# Patient Record
Sex: Male | Born: 1935 | Race: White | Hispanic: No | Smoking: Former smoker
Health system: Southern US, Community
[De-identification: ages and names within clinical notes are randomized; demographics above are authoritative.]

## PROBLEM LIST (undated history)

## (undated) DIAGNOSIS — E785 Hyperlipidemia, unspecified: Secondary | ICD-10-CM

## (undated) DIAGNOSIS — R053 Chronic cough: Secondary | ICD-10-CM

## (undated) DIAGNOSIS — F419 Anxiety disorder, unspecified: Secondary | ICD-10-CM

## (undated) DIAGNOSIS — I10 Essential (primary) hypertension: Secondary | ICD-10-CM

## (undated) DIAGNOSIS — R05 Cough: Secondary | ICD-10-CM

## (undated) HISTORY — PX: KNEE SURGERY: SHX244

## (undated) HISTORY — PX: TONSILLECTOMY: SUR1361

## (undated) HISTORY — DX: Chronic cough: R05.3

## (undated) HISTORY — DX: Cough: R05

## (undated) HISTORY — DX: Hyperlipidemia, unspecified: E78.5

## (undated) HISTORY — DX: Anxiety disorder, unspecified: F41.9

---

## 2013-05-14 ENCOUNTER — Emergency Department (HOSPITAL_BASED_OUTPATIENT_CLINIC_OR_DEPARTMENT_OTHER): Payer: Medicare Other

## 2013-05-14 ENCOUNTER — Emergency Department (HOSPITAL_BASED_OUTPATIENT_CLINIC_OR_DEPARTMENT_OTHER)
Admission: EM | Admit: 2013-05-14 | Discharge: 2013-05-14 | Disposition: A | Discharge status: Home / Self Care | Payer: Medicare Other | Attending: Emergency Medicine | Admitting: Emergency Medicine

## 2013-05-14 ENCOUNTER — Encounter (HOSPITAL_BASED_OUTPATIENT_CLINIC_OR_DEPARTMENT_OTHER): Payer: Self-pay

## 2013-05-14 ENCOUNTER — Other Ambulatory Visit: Payer: Self-pay

## 2013-05-14 DIAGNOSIS — Z79899 Other long term (current) drug therapy: Secondary | ICD-10-CM | POA: Insufficient documentation

## 2013-05-14 DIAGNOSIS — F17200 Nicotine dependence, unspecified, uncomplicated: Secondary | ICD-10-CM | POA: Insufficient documentation

## 2013-05-14 DIAGNOSIS — R42 Dizziness and giddiness: Secondary | ICD-10-CM | POA: Insufficient documentation

## 2013-05-14 DIAGNOSIS — H5710 Ocular pain, unspecified eye: Secondary | ICD-10-CM | POA: Insufficient documentation

## 2013-05-14 DIAGNOSIS — I10 Essential (primary) hypertension: Secondary | ICD-10-CM

## 2013-05-14 HISTORY — DX: Essential (primary) hypertension: I10

## 2013-05-14 LAB — URINALYSIS, ROUTINE W REFLEX MICROSCOPIC
Bilirubin Urine: NEGATIVE
Glucose, UA: NEGATIVE mg/dL
Specific Gravity, Urine: 1.014 (ref 1.005–1.030)
pH: 6.5 (ref 5.0–8.0)

## 2013-05-14 LAB — URINE MICROSCOPIC-ADD ON

## 2013-05-14 NOTE — ED Provider Notes (Signed)
History  This chart was scribed for Glynn Octave, MD by Ardelia Mems, ED Scribe. This patient was seen in room MH06/MH06 and the patient's care was started at 9:48 PM.  CSN: [id number]  Arrival date & time 05/14/13  2035    Chief Complaint  Patient presents with  . Hypertension     The history is provided by the patient. No language interpreter was used.   HPI Comments: Andres Murray is a 79 y.o. male with a hx of HTN who presents to the Emergency Department complaining of HTN that is elevated above baseline with 3 hours of associated pain in the corner of his right eye, which has now subsided. Pt states that he has had similar pain with spikes in his BP. Pt denies headache and denies visual disturbances. He reports feeling light headed but denies associated dizziness. Pt states that he has a long hx of HTN, and has taken BP medications for years. Pt states that he has not had a recent change in dosage of his BP medications, but he desires one. Pt states that he is compliant with his BP medication and states that he took it tonight. Pt denies eye pain currently and states that he feels fine now. Pt states that his vision is normal. Pt's daily medications are Nexium, Lipitor, Flomax and Exforge for his HTN. Pt was alarmed that his BP was 174/- 2 hours ago, which he states is significantly elevated for him. He states that he checks his BP at home regularly, and sees his PCP for HTN check-ups every 6 months. Pt denies chest pain, SOB, abdominal pain or any other symptoms.  PCP- None  Past Medical History  Diagnosis Date  . Hypertension    History reviewed. No pertinent past surgical history. No family history on file. History  Substance Use Topics  . Smoking status: Current Every Day Smoker    Types: Cigars  . Smokeless tobacco: Not on file  . Alcohol Use: Yes    Review of Systems  Constitutional: Negative for fever and chills.  HENT: Negative for congestion, sore throat and  rhinorrhea.   Eyes: Positive for pain. Negative for visual disturbance.  Respiratory: Negative for cough and shortness of breath.   Cardiovascular: Negative for chest pain.  Gastrointestinal: Negative for nausea, vomiting, abdominal pain and diarrhea.  Genitourinary: Negative for dysuria.  Musculoskeletal: Negative for back pain.  Skin: Negative for rash.  Neurological: Positive for light-headedness. Negative for dizziness and headaches.  Psychiatric/Behavioral: Negative for confusion.  A complete 10 system review of systems was obtained and all systems are negative except as noted in the HPI and PMH.   Allergies  Review of patient's allergies indicates no known allergies.  Home Medications   Current Outpatient Rx  Name  Route  Sig  Dispense  Refill  . amLODipine-valsartan (EXFORGE) 5-320 MG per tablet   Oral   Take 1 tablet by mouth daily.         [name] atorvastatin (LIPITOR) 20 MG tablet   Oral   Take 20 mg by mouth daily.         [name] esomeprazole (NEXIUM) 40 MG capsule   Oral   Take 40 mg by mouth daily before breakfast.         . tamsulosin (FLOMAX) 0.4 MG CAPS   Oral   Take 0.4 mg by mouth.          Triage Vitals: BP 168/83  Pulse 80  Temp(Src) 98.7 F (37.1 C) (Oral)  Resp 20  Ht 5\' 5"  (1.651 m)  Wt 138 lb (62.596 kg)  BMI 22.96 kg/m2  SpO2 98%  Physical Exam  Nursing note and vitals reviewed. Constitutional: He is oriented to person, place, and time. He appears well-developed and well-nourished. No distress.  HENT:  Head: Normocephalic and atraumatic.  Mouth/Throat: Oropharynx is clear and moist. No oropharyngeal exudate.  Eyes: Conjunctivae and EOM are normal. Pupils are equal, round, and reactive to light. Right eye exhibits no discharge. Left eye exhibits no discharge.  Neck: Normal range of motion. Neck supple. No tracheal deviation present.  Cardiovascular: Normal rate.   No murmur heard. Pulmonary/Chest: Effort normal. No respiratory distress.   Abdominal: Soft. There is no tenderness. There is no rebound and no guarding.  Musculoskeletal: Normal range of motion. He exhibits no edema and no tenderness.  Neurological: He is alert and oriented to person, place, and time. No cranial nerve deficit. He exhibits normal muscle tone. Coordination normal.   CN 2-12 intact, no ataxia on finger to nose, no nystagmus, 5/5 strength throughout, no pronator drift, Romberg negative, normal gait.   Skin: Skin is warm and dry.  Psychiatric: He has a normal mood and affect. His behavior is normal.    ED Course  Procedures (including critical care time)  DIAGNOSTIC STUDIES: Oxygen Saturation is 98% on RA, normal by my interpretation.    COORDINATION OF CARE: 9:55 PM- Pt advised of plan for treatment and pt agrees.   Labs Reviewed  URINALYSIS, ROUTINE W REFLEX MICROSCOPIC - Abnormal; Notable for the following:    Hgb urine dipstick SMALL (*)    All other components within normal limits  URINE MICROSCOPIC-ADD ON   Ct Head Wo Contrast  05/14/2013   *RADIOLOGY REPORT*  Clinical Data: Headache.  Hypertension.  CT HEAD WITHOUT CONTRAST  Technique:  Contiguous axial images were obtained from the base of the skull through the vertex without contrast.  Comparison: None.  Findings: No mass lesion, mass effect, midline shift, hydrocephalus, hemorrhage.  No acute territorial cortical ischemia/infarct. Atrophy and chronic ischemic white matter disease is present.  Right maxillary antrectomy is present. There is fat attenuation in the quadrigeminal plate cistern, most compatible with a dermoid.  There is no fat within the ventricular system to suggest ruptured dermoid cyst.  Intracranial atherosclerosis.  IMPRESSION: 1.  No acute intracranial abnormality. 2.  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                                                                       Original Report Authenticated By: Andreas Newport, M.D.    No diagnosis found.  MDM   Patient complains of elevated blood pressure. Denies headache, eye pain, vision change, chest pain or shortness of breath. States compliance with medications. Some pain below his eye earlier tonight but is now resolved. Spouse states he had elevated blood pressure for a long time. No vision changes, weakness, numbness, tingling, speech changes to suggest TIA. He refuses any blood work. Neuro exam is nonfocal. Blood pressure has been 140s to 160s in the ED systolic. Patient is asymptomatic. No chest pain, shortness of breath, dizziness or lightheadedness.  He is he refuses any blood work to assess for kidney function.  He is advised to followup with his doctor regarding his blood pressure medication adjustments. Return precautions discussed. BP 150/66  Pulse 80  Temp(Src) 98.7 F (37.1 C) (Oral)  Resp 20  Ht 5\' 5"  (1.651 m)  Wt 138 lb (62.596 kg)  BMI 22.96 kg/m2  SpO2 98%    Date: 05/14/2013  Rate: 64  Rhythm: normal sinus rhythm  QRS Axis: normal  Intervals: normal  ST/T Wave abnormalities: normal  Conduction Disutrbances:none  Narrative Interpretation:   Old EKG Reviewed: none available    I personally performed the services described in this documentation, which was scribed in my presence. The recorded information has been reviewed and is accurate.     Glynn Octave, MD 05/15/13 [phone]

## 2013-05-14 NOTE — ED Notes (Signed)
C/o elevated BP x 5 days-pain to right eye x 1-2 hours-denies pain at present

## 2014-03-09 ENCOUNTER — Emergency Department (HOSPITAL_BASED_OUTPATIENT_CLINIC_OR_DEPARTMENT_OTHER): Payer: Medicare Other

## 2014-03-09 ENCOUNTER — Emergency Department (HOSPITAL_BASED_OUTPATIENT_CLINIC_OR_DEPARTMENT_OTHER)
Admission: EM | Admit: 2014-03-09 | Discharge: 2014-03-09 | Disposition: A | Discharge status: Other Acute Inpatient Hospital | Payer: Medicare Other | Attending: Emergency Medicine | Admitting: Emergency Medicine

## 2014-03-09 ENCOUNTER — Encounter (HOSPITAL_BASED_OUTPATIENT_CLINIC_OR_DEPARTMENT_OTHER): Payer: Self-pay | Admitting: Emergency Medicine

## 2014-03-09 DIAGNOSIS — E7800 Pure hypercholesterolemia, unspecified: Secondary | ICD-10-CM | POA: Insufficient documentation

## 2014-03-09 DIAGNOSIS — I10 Essential (primary) hypertension: Secondary | ICD-10-CM | POA: Insufficient documentation

## 2014-03-09 DIAGNOSIS — Z79899 Other long term (current) drug therapy: Secondary | ICD-10-CM | POA: Diagnosis not present

## 2014-03-09 DIAGNOSIS — R4182 Altered mental status, unspecified: Secondary | ICD-10-CM | POA: Insufficient documentation

## 2014-03-09 DIAGNOSIS — F17200 Nicotine dependence, unspecified, uncomplicated: Secondary | ICD-10-CM | POA: Insufficient documentation

## 2014-03-09 LAB — DIFFERENTIAL
Basophils Absolute: 0 10*3/uL (ref 0.0–0.1)
Basophils Relative: 1 % (ref 0–1)
EOS PCT: 5 % (ref 0–5)
Eosinophils Absolute: 0.3 10*3/uL (ref 0.0–0.7)
LYMPHS ABS: 1.3 10*3/uL (ref 0.7–4.0)
LYMPHS PCT: 21 % (ref 12–46)
MONO ABS: 0.7 10*3/uL (ref 0.1–1.0)
Monocytes Relative: 11 % (ref 3–12)
Neutro Abs: 4.2 10*3/uL (ref 1.7–7.7)
Neutrophils Relative %: 64 % (ref 43–77)

## 2014-03-09 LAB — COMPREHENSIVE METABOLIC PANEL
ALT: 20 U/L (ref 0–53)
AST: 21 U/L (ref 0–37)
Albumin: 4.2 g/dL (ref 3.5–5.2)
Alkaline Phosphatase: 61 U/L (ref 39–117)
BILIRUBIN TOTAL: 0.4 mg/dL (ref 0.3–1.2)
BUN: 16 mg/dL (ref 6–23)
CALCIUM: 10.2 mg/dL (ref 8.4–10.5)
CO2: 31 meq/L (ref 19–32)
Chloride: 102 mEq/L (ref 96–112)
Creatinine, Ser: 1.1 mg/dL (ref 0.50–1.35)
GFR, EST AFRICAN AMERICAN: 73 mL/min — AB (ref >90)
GFR, EST NON AFRICAN AMERICAN: 63 mL/min — AB (ref >90)
GLUCOSE: 145 mg/dL — AB (ref 70–99)
Potassium: 4 mEq/L (ref 3.7–5.3)
SODIUM: 144 meq/L (ref 137–147)
Total Protein: 7.2 g/dL (ref 6.0–8.3)

## 2014-03-09 LAB — URINALYSIS, ROUTINE W REFLEX MICROSCOPIC
Bilirubin Urine: NEGATIVE
Glucose, UA: NEGATIVE mg/dL
HGB URINE DIPSTICK: NEGATIVE
KETONES UR: NEGATIVE mg/dL
Leukocytes, UA: NEGATIVE
Nitrite: NEGATIVE
PROTEIN: NEGATIVE mg/dL
Specific Gravity, Urine: 1.014 (ref 1.005–1.030)
UROBILINOGEN UA: 0.2 mg/dL (ref 0.0–1.0)
pH: 7.5 (ref 5.0–8.0)

## 2014-03-09 LAB — CBC
HEMATOCRIT: 44.2 % (ref 39.0–52.0)
Hemoglobin: 14.6 g/dL (ref 13.0–17.0)
MCH: 30.5 pg (ref 26.0–34.0)
MCHC: 33 g/dL (ref 30.0–36.0)
MCV: 92.3 fL (ref 78.0–100.0)
PLATELETS: 175 10*3/uL (ref 150–400)
RBC: 4.79 MIL/uL (ref 4.22–5.81)
RDW: 14.1 % (ref 11.5–15.5)
WBC: 6.6 10*3/uL (ref 4.0–10.5)

## 2014-03-09 LAB — RAPID URINE DRUG SCREEN, HOSP PERFORMED
AMPHETAMINES: NOT DETECTED
BENZODIAZEPINES: NOT DETECTED
Barbiturates: NOT DETECTED
Cocaine: NOT DETECTED
Opiates: NOT DETECTED
TETRAHYDROCANNABINOL: NOT DETECTED

## 2014-03-09 LAB — ETHANOL: Alcohol, Ethyl (B): <11 mg/dL (ref 0–11)

## 2014-03-09 LAB — APTT: aPTT: 30 seconds (ref 24–37)

## 2014-03-09 LAB — PROTIME-INR
INR: 0.98 (ref 0.00–1.49)
Prothrombin Time: 12.8 seconds (ref 11.6–15.2)

## 2014-03-09 LAB — TROPONIN I
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
## 2014-03-09 NOTE — ED Notes (Signed)
Patient has returned from MRI.  On assessment, is not following directions, is only able to verbalize his name when asked.  Dr. Stark Jock made aware that swallow screen not attempted d/t inability to follow commands.  Also, Dr. Stark Jock aware of patient's blood pressure.  He does not want to lower it at this time.  [facility] here to transport patient.

## 2014-03-09 NOTE — ED Notes (Signed)
Patient transported to CT 

## 2014-03-09 NOTE — ED Notes (Signed)
Pt unable to urinate.  RN and RT at bedside to assist pt.
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
## 2014-03-09 NOTE — ED Notes (Signed)
Patient is currently in radiology for MRI.

## 2014-03-09 NOTE — ED Notes (Signed)
MRi images were sent to [facility] pacs system at 16:06 by [facility]

## 2014-03-09 NOTE — ED Notes (Signed)
CT Scan images were sent to [facility] pacs system at 15:48 by [facility].  When the MRi images are processed I will forward those images as well.

## 2016-09-06 ENCOUNTER — Ambulatory Visit
Admission: RE | Admit: 2016-09-06 | Discharge: 2016-09-06 | Disposition: A | Discharge status: Home / Self Care | Payer: Medicare Other | Source: Ambulatory Visit | Attending: Neurology | Admitting: Neurology

## 2016-09-06 ENCOUNTER — Encounter: Payer: Self-pay | Admitting: Neurology

## 2016-09-06 ENCOUNTER — Ambulatory Visit (INDEPENDENT_AMBULATORY_CARE_PROVIDER_SITE_OTHER): Payer: Medicare Other | Admitting: Neurology

## 2016-09-06 DIAGNOSIS — F419 Anxiety disorder, unspecified: Secondary | ICD-10-CM | POA: Diagnosis not present

## 2016-09-06 DIAGNOSIS — R053 Chronic cough: Secondary | ICD-10-CM

## 2016-09-06 DIAGNOSIS — R05 Cough: Secondary | ICD-10-CM | POA: Insufficient documentation

## 2016-09-06 DIAGNOSIS — R9089 Other abnormal findings on diagnostic imaging of central nervous system: Secondary | ICD-10-CM

## 2016-09-06 MED ORDER — NORTRIPTYLINE HCL 25 MG PO CAPS: 50.0000 mg | ORAL_CAPSULE | Freq: Every day | ORAL | 11 refills | Status: DC

## 2016-09-06 NOTE — Progress Notes (Signed)
PATIENT: Andres Murray DOB: [birth year]  Chief Complaint  Patient presents with  . [name]    He was referred here to discuss his symptoms of daily anxiety and nervousness.  States he has also developed a persistant, dry cough over the last six months.  [name] PCP    Andres Bald, MD     HISTORICAL  Andres Murray is a 82 years old right-handed male, seen in refer by his primary care Andres Murray for evaluation of anxiety, frequent dry cough, initial evaluation was on September 06 2016.   I reviewed and summarized the referral note, he had a history of hyperlipidemia, hypertension, has been on combination of amlodipine, valsartan treatment for more than 10 years, he is going through a lot of stress recently, he is going to move out of his house to assistant living around January 2018, he has to give up his dog, his wife suffered memory loss, he was recently diagnosed with prostate cancer in August 2017, will have treatment soon.  Around spring of 2017, he began to notice worsening anxiety, feeling nervousness, frequent dry cough, body movement, difficulty falling to sleep, over past few months, he has been taking Tylenol or Advil PM, which helped her symptoms some.  During interview, he was noticed to have frequent dry cough, shoulder shrugging body twisting, tic-like abnormal movement, he denied previous history of tic  He denies visual loss, no significant memory loss, he is a retired school principal at age 89, still enjoys reading,  We have personally reviewed MRI of the brain without contrast in 2015, extensive periventricular white matter disease consistent with small vessel disease, incidental finding of quadrigeminal plate lipoma  REVIEW OF SYSTEMS: Full 14 system review of systems performed and notable only for as above  ALLERGIES: No Known Allergies  HOME MEDICATIONS: Current Outpatient Prescriptions  Medication Sig Dispense Refill  . amLODipine-valsartan (EXFORGE)  5-320 MG per tablet Take 1 tablet by mouth daily.    [name] atorvastatin (LIPITOR) 20 MG tablet Take 20 mg by mouth daily.    [name] esomeprazole (NEXIUM) 40 MG capsule Take 40 mg by mouth daily before breakfast.    . fluticasone (FLONASE) 50 MCG/ACT nasal spray as needed.    . metoCLOPramide (REGLAN) 10 MG tablet daily.    . metoprolol succinate (TOPROL-XL) 25 MG 24 hr tablet daily.     No current facility-administered medications for this visit.     PAST MEDICAL HISTORY: Past Medical History:  Diagnosis Date  . Anxiety   . Hyperlipemia   . Hypertension   . Persistent dry cough     PAST SURGICAL HISTORY: Past Surgical History:  Procedure Laterality Date  . KNEE SURGERY Right   . TONSILLECTOMY      FAMILY HISTORY: Family History  Problem Relation Age of Onset  . Diabetes Mother   . Appendicitis Father     SOCIAL HISTORY:  Social History   Social History  . Marital status: Married    Spouse name: N/A  . Number of children: 2  . Years of education: Doctorate   Occupational History  . Retired    Social History Main Topics  . Smoking status: Current Every Day Smoker    Types: Cigars  . Smokeless tobacco: Never Used  . Alcohol use Yes     Comment: Occasional beer  . Drug use: No  . Sexual activity: Not on file   Other Topics Concern  . Not on file   Social History Narrative  Lives at home with wife, Andres Murray.   Right-handed.   4-5 cups caffeine daily.     PHYSICAL EXAM   Vitals:   09/06/16 1047  BP: 132/75  Pulse: 66  Weight: 138 lb (62.6 kg)  Height: 5\' 5"  (1.651 m)    Not recorded      Body mass index is 22.96 kg/m.  PHYSICAL EXAMNIATION:  Gen: NAD, conversant, well nourised, obese, well groomed                     Cardiovascular: Regular rate rhythm, no peripheral edema, warm, nontender. Eyes: Conjunctivae clear without exudates or hemorrhage Neck: Supple, no carotid bruits. Pulmonary: Clear to auscultation bilaterally   NEUROLOGICAL  EXAM:  MENTAL STATUS:Anxious looking elderly male, with frequent dry cough, body movement, shoulder shrugging, suggestive of motor tics, Speech:    Speech is normal; fluent and spontaneous with normal comprehension.  Cognition:     Orientation to time, place and person     Normal recent and remote memory     Normal Attention span and concentration     Normal Language, naming, repeating,spontaneous speech     Fund of knowledge   CRANIAL NERVES: CN II: Visual fields are full to confrontation. Fundoscopic exam is normal with sharp discs and no vascular changes. Pupils are round equal and briskly reactive to light. CN III, IV, VI: extraocular movement are normal. No ptosis. CN V: Facial sensation is intact to pinprick in all 3 divisions bilaterally. Corneal responses are intact.  CN VII: Face is symmetric with normal eye closure and smile. CN VIII: Hearing is normal to rubbing fingers CN IX, X: Palate elevates symmetrically. Phonation is normal. CN XI: Head turning and shoulder shrug are intact CN XII: Tongue is midline with normal movements and no atrophy.  MOTOR: There is no pronator drift of out-stretched arms. Muscle bulk and tone are normal. Muscle strength is normal.  REFLEXES: Reflexes are 2+ and symmetric at the biceps, triceps, knees, and ankles. Plantar responses are flexor.  SENSORY: Intact to light touch, pinprick, positional sensation and vibratory sensation are intact in fingers and toes.  COORDINATION: Rapid alternating movements and fine finger movements are intact. There is no dysmetria on finger-to-nose and heel-knee-shin.    GAIT/STANCE: Posture is normal. Gait is steady with normal steps, base, arm swing, and turning. Heel and toe walking are normal. Tandem gait is normal.  Romberg is absent.   DIAGNOSTIC DATA (LABS, IMAGING, TESTING) - I reviewed patient records, labs, notes, testing and imaging myself where available.   ASSESSMENT AND PLAN  Andres Murray is  a 82 y.o. male   New onset anxiety, frequent cough, motor tic-like symptoms, Denied previous history of tic, Differentiation diagnosis includes anxiety, early phase of central nervous system degenerative disorder Proceed with MRI of brain with and without contrast, had previous abnormal MRI brain 2015 Will try nortriptyline 25 mg 1-2 tablets every night for his anxiety, chronic insomnia    Marcial Pacas, M.D. Ph.D.  [facility] [address] [place], [place] [address] Ph: [phone] Fax: [phone]  CC: Referring Provider

## 2016-09-24 ENCOUNTER — Telehealth: Payer: Self-pay | Admitting: Neurology

## 2016-09-24 ENCOUNTER — Other Ambulatory Visit: Payer: Self-pay | Admitting: Neurology

## 2016-09-24 NOTE — Telephone Encounter (Signed)
Monique[employer] [phone]  NIA line or can fax an order with the dx code to [phone] called request dx for MRI

## 2016-09-24 NOTE — Telephone Encounter (Signed)
I received a page for this authorization for MRI unfortunately the number they gave me is wrong [phone] and this is an energy company

## 2016-09-24 NOTE — Telephone Encounter (Signed)
Please fax Diagnosis code as   Abnormal brain MRI           Details Code: R90.89

## 2016-09-24 NOTE — Telephone Encounter (Signed)
MRI order printed/signed, requested diagnosis code added and faxed to [facility].

## 2016-09-27 ENCOUNTER — Other Ambulatory Visit: Payer: Medicare Other

## 2016-09-27 NOTE — Telephone Encounter (Signed)
OnslowYX:[id number] (ex. 09/27/16 to 10/17/17). Resent order to GI

## 2016-10-05 ENCOUNTER — Other Ambulatory Visit: Payer: Self-pay | Admitting: Neurology

## 2016-10-11 ENCOUNTER — Ambulatory Visit
Admission: RE | Admit: 2016-10-11 | Discharge: 2016-10-11 | Disposition: A | Discharge status: Home / Self Care | Payer: Medicare Other | Source: Ambulatory Visit | Attending: Neurology | Admitting: Neurology

## 2016-10-11 DIAGNOSIS — F419 Anxiety disorder, unspecified: Secondary | ICD-10-CM

## 2016-10-11 DIAGNOSIS — R9089 Other abnormal findings on diagnostic imaging of central nervous system: Secondary | ICD-10-CM

## 2016-10-11 DIAGNOSIS — R05 Cough: Secondary | ICD-10-CM

## 2016-10-11 DIAGNOSIS — R053 Chronic cough: Secondary | ICD-10-CM

## 2016-10-11 MED ORDER — GADOBENATE DIMEGLUMINE 529 MG/ML IV SOLN
12.0000 mL | Freq: Once | INTRAVENOUS | Status: AC | PRN
  Administered 2016-10-11: 12 mL via INTRAVENOUS

## 2016-10-13 ENCOUNTER — Telehealth: Payer: Self-pay | Admitting: Neurology

## 2016-10-13 NOTE — Telephone Encounter (Signed)
Pt aware of results and will keep his follow up appt to further discuss results.

## 2016-10-13 NOTE — Telephone Encounter (Signed)
please call patient, MRI of the brain showed significant atrophy, supratentorium small vessel disease, I will review findings with him at next follow-up visit   IMPRESSION:  Abnormal MRI brain (with and without) demonstrating: 1. Moderate perisylvian atrophy. 2. Mild-moderate periventricular and subcortical chronic small vessel ischemic disease. 3. Stable right superior cerebellar lipoma. 4. No acute findings. No change from MRI on 03/11/16 and 03/13/16.

## 2016-11-03 ENCOUNTER — Encounter: Payer: Self-pay | Admitting: Neurology

## 2016-11-03 ENCOUNTER — Ambulatory Visit (INDEPENDENT_AMBULATORY_CARE_PROVIDER_SITE_OTHER): Payer: Medicare Other | Admitting: Neurology

## 2016-11-03 VITALS — BP 112/70 | HR 88 | Ht 65.0 in | Wt 137.0 lb

## 2016-11-03 DIAGNOSIS — F419 Anxiety disorder, unspecified: Secondary | ICD-10-CM | POA: Diagnosis not present

## 2016-11-03 DIAGNOSIS — G3184 Mild cognitive impairment, so stated: Secondary | ICD-10-CM | POA: Insufficient documentation

## 2016-11-03 DIAGNOSIS — R9089 Other abnormal findings on diagnostic imaging of central nervous system: Secondary | ICD-10-CM

## 2016-11-03 NOTE — Progress Notes (Signed)
PATIENT: Andres Murray DOB: 27-May-1934  Chief Complaint  Patient presents with  . [name]    He was referred here to discuss his symptoms of daily anxiety and nervousness.  States he has also developed a persistant, dry cough over the last six months.  [name] PCP    [name], MD     HISTORICAL  Andres Murray is a 82 years old right-handed male, seen in refer by his primary care Andres Murray for evaluation of anxiety, frequent dry cough, initial evaluation was on September 06 2016.   I reviewed and summarized the referral note, he had a history of hyperlipidemia, hypertension, has been on combination of amlodipine, valsartan treatment for more than 10 years, he is going through a lot of stress recently, he is going to move out of his house to assistant living around January 2018, he has to give up his dog, his wife suffered memory loss, he was recently diagnosed with prostate cancer in August 2017, will have treatment soon.  Around spring of 2017, he began to notice worsening anxiety, feeling nervousness, frequent dry cough, body movement, difficulty falling to sleep, over past few months, he has been taking Tylenol or Advil PM, which helped her symptoms some.  During interview, he was noticed to have frequent dry cough, shoulder shrugging body twisting, tic-like abnormal movement, he denied previous history of tic  He denies visual loss, no significant memory loss, he is a retired school principal at age 68, still enjoys reading,  We have personally reviewed MRI of the brain without contrast in 2015, extensive periventricular white matter disease consistent with small vessel disease, incidental finding of quadrigeminal plate lipoma  UPDATE Nov 03 2016: He is now taking nortriptyline 50mg  qhs, which has helped him sleep, but he still has difficulty sleepiness.  During the day, he still has frequent body jerking, clear his throat, he complains of depression anxiety, chronic  insomnia,  We have personally reviewed MRI scan on October 11 2016, moderate perisylvian atrophy, mild to moderate periventricular and subcortical chronic small vessel disease, mild progression compared to previous scan in 2015,  REVIEW OF SYSTEMS: Full 14 system review of systems performed and notable only for: Anxiety, depression  ALLERGIES: No Known Allergies  HOME MEDICATIONS: Current Outpatient Prescriptions  Medication Sig Dispense Refill  . amLODipine-valsartan (EXFORGE) 5-320 MG per tablet Take 1 tablet by mouth daily.    [name] atorvastatin (LIPITOR) 20 MG tablet Take 20 mg by mouth daily.    [name] esomeprazole (NEXIUM) 40 MG capsule Take 40 mg by mouth daily before breakfast.    . fluticasone (FLONASE) 50 MCG/ACT nasal spray as needed.    . metoCLOPramide (REGLAN) 10 MG tablet daily.    . metoprolol succinate (TOPROL-XL) 25 MG 24 hr tablet daily.    . nortriptyline (PAMELOR) 25 MG capsule Take 2 capsules (50 mg total) by mouth at bedtime. 60 capsule 11   No current facility-administered medications for this visit.     PAST MEDICAL HISTORY: Past Medical History:  Diagnosis Date  . Anxiety   . Hyperlipemia   . Hypertension   . Persistent dry cough     PAST SURGICAL HISTORY: Past Surgical History:  Procedure Laterality Date  . KNEE SURGERY Right   . TONSILLECTOMY      FAMILY HISTORY: Family History  Problem Relation Age of Onset  . Diabetes Mother   . Appendicitis Father     SOCIAL HISTORY:  Social History   Social History  .  Marital status: Married    Spouse name: N/A  . Number of children: 2  . Years of education: Doctorate   Occupational History  . Retired    Social History Main Topics  . Smoking status: Current Every Day Smoker    Types: Cigars  . Smokeless tobacco: Never Used  . Alcohol use Yes     Comment: Occasional beer  . Drug use: No  . Sexual activity: Not on file   Other Topics Concern  . Not on file   Social History Narrative   Lives  at home with wife, Andres Murray.   Right-handed.   4-5 cups caffeine daily.     PHYSICAL EXAM   Vitals:   11/03/16 1005  BP: 112/70  Pulse: 88  Weight: 137 lb (62.1 kg)  Height: 5\' 5"  (1.651 m)    Not recorded      Body mass index is 22.8 kg/m.  PHYSICAL EXAMNIATION:  Gen: NAD, conversant, well nourised, obese, well groomed                     Cardiovascular: Regular rate rhythm, no peripheral edema, warm, nontender. Eyes: Conjunctivae clear without exudates or hemorrhage Neck: Supple, no carotid bruits. Pulmonary: Clear to auscultation bilaterally   NEUROLOGICAL EXAM:  MENTAL STATUS:Anxious looking elderly male, with frequent dry cough, body movement, shoulder shrugging, suggestive of motor tics, Speech:    Speech is normal; fluent and spontaneous with normal comprehension.  Cognition:     Orientation to time, place and person     Normal recent and remote memory     Normal Attention span and concentration     Normal Language, naming, repeating,spontaneous speech     Fund of knowledge   CRANIAL NERVES: CN II: Visual fields are full to confrontation. Fundoscopic exam is normal with sharp discs and no vascular changes. Pupils are round equal and briskly reactive to light. CN III, IV, VI: extraocular movement are normal. No ptosis. CN V: Facial sensation is intact to pinprick in all 3 divisions bilaterally. Corneal responses are intact.  CN VII: Face is symmetric with normal eye closure and smile. CN VIII: Hearing is normal to rubbing fingers CN IX, X: Palate elevates symmetrically. Phonation is normal. CN XI: Head turning and shoulder shrug are intact CN XII: Tongue is midline with normal movements and no atrophy.  MOTOR: There is no pronator drift of out-stretched arms. Muscle bulk and tone are normal. Muscle strength is normal.  REFLEXES: Reflexes are 2+ and symmetric at the biceps, triceps, knees, and ankles. Plantar responses are flexor.  SENSORY: Intact to light  touch, pinprick, positional sensation and vibratory sensation are intact in fingers and toes.  COORDINATION: Rapid alternating movements and fine finger movements are intact. There is no dysmetria on finger-to-nose and heel-knee-shin.    GAIT/STANCE: Posture is normal. Gait is steady with normal steps, base, arm swing, and turning. Heel and toe walking are normal. Tandem gait is normal.  Romberg is absent.   DIAGNOSTIC DATA (LABS, IMAGING, TESTING) - I reviewed patient records, labs, notes, testing and imaging myself where available.   ASSESSMENT AND PLAN  Andres Murray is a 82 y.o. male   New onset anxiety, frequent cough, motor tic-like symptoms, MRI of the brain showed evidence of perisylvian brain atrophy, mild to moderate periventricular small vessel disease. He also complains of mild short-term memory loss, above complaints could be early signs of central nervous system degenerative disorder Add on Zoloft 50 mg daily Clonazepam  0.5 milligrams every night for sleeping      Marcial Pacas, M.D. Ph.D.  [facility] [address], [place], [place] [address] Ph: [phone] Fax: [phone]  CC: Referring Provider

## 2016-11-03 NOTE — Addendum Note (Signed)
Addended by: Marcial Pacas on: 11/03/2016 05:05 PM   Modules accepted: Orders

## 2016-11-10 ENCOUNTER — Telehealth: Payer: Self-pay | Admitting: Neurology

## 2016-11-10 MED ORDER — CLONAZEPAM 0.5 MG PO TABS: 0.5000 mg | ORAL_TABLET | Freq: Two times a day (BID) | ORAL | 4 refills | Status: DC | PRN

## 2016-11-10 MED ORDER — CLONAZEPAM 0.5 MG PO TABS: 0.5000 mg | ORAL_TABLET | Freq: Every evening | ORAL | 4 refills | Status: DC | PRN

## 2016-11-10 MED ORDER — SERTRALINE HCL 50 MG PO TABS: 50.0000 mg | ORAL_TABLET | Freq: Every day | ORAL | 11 refills | Status: DC

## 2016-11-10 NOTE — Addendum Note (Signed)
Addended by: Marcial Pacas on: 11/10/2016 03:45 PM   Modules accepted: Orders

## 2016-11-10 NOTE — Telephone Encounter (Signed)
Please call patient, I have called in Zoloft 50 mg every morning, clonazepam 0.5 milligram every night for his sleep,

## 2016-11-10 NOTE — Telephone Encounter (Signed)
Pt aware - Zoloft sent electronically and clonazepam faxed to his pharmacy.

## 2016-11-10 NOTE — Addendum Note (Signed)
Addended by: Marcial Pacas on: 11/10/2016 03:47 PM   Modules accepted: Orders

## 2016-11-22 ENCOUNTER — Ambulatory Visit: Payer: Medicare Other | Admitting: Neurology

## 2017-01-08 ENCOUNTER — Other Ambulatory Visit: Payer: Self-pay

## 2017-01-08 ENCOUNTER — Emergency Department (HOSPITAL_BASED_OUTPATIENT_CLINIC_OR_DEPARTMENT_OTHER)
Admission: EM | Admit: 2017-01-08 | Discharge: 2017-01-08 | Disposition: A | Discharge status: Home / Self Care | Payer: Medicare Other | Attending: Emergency Medicine | Admitting: Emergency Medicine

## 2017-01-08 ENCOUNTER — Emergency Department (HOSPITAL_BASED_OUTPATIENT_CLINIC_OR_DEPARTMENT_OTHER): Payer: Medicare Other

## 2017-01-08 ENCOUNTER — Encounter (HOSPITAL_BASED_OUTPATIENT_CLINIC_OR_DEPARTMENT_OTHER): Payer: Self-pay | Admitting: Emergency Medicine

## 2017-01-08 DIAGNOSIS — Z79899 Other long term (current) drug therapy: Secondary | ICD-10-CM | POA: Insufficient documentation

## 2017-01-08 DIAGNOSIS — Z87891 Personal history of nicotine dependence: Secondary | ICD-10-CM | POA: Diagnosis not present

## 2017-01-08 DIAGNOSIS — I10 Essential (primary) hypertension: Secondary | ICD-10-CM | POA: Diagnosis not present

## 2017-01-08 DIAGNOSIS — R42 Dizziness and giddiness: Secondary | ICD-10-CM | POA: Diagnosis present

## 2017-01-08 MED ORDER — LIDOCAINE HCL (PF) 1 % IJ SOLN: 5.0000 mL | Freq: Once | INTRAMUSCULAR | Status: DC

## 2017-01-08 MED ORDER — MECLIZINE HCL 25 MG PO TABS: 25.0000 mg | ORAL_TABLET | Freq: Three times a day (TID) | ORAL | 0 refills | Status: DC | PRN

## 2017-01-08 MED ORDER — MECLIZINE HCL 25 MG PO TABS
25.0000 mg | ORAL_TABLET | Freq: Once | ORAL | Status: AC
  Administered 2017-01-08: 25 mg via ORAL
  Filled 2017-01-08: qty 1

## 2017-01-08 NOTE — ED Notes (Signed)
Patient transported to CT 

## 2017-01-08 NOTE — Discharge Instructions (Signed)
Read the information below.  Your CT scan was re-assuring. Your dizziness may be peripheral in nature. You are being prescribed meclizine, please take as directed.  Use a walker or cane at home, make transitions slowly, ask for help.  Use the prescribed medication as directed.  Please discuss all new medications with your pharmacist.   Please follow up with Ear, Nose, and Throat on Monday, contact information provided.  Please follow up with your primary provider within one week for re-evaluation.  You may return to the Emergency Department at any time for worsening condition or any new symptoms that concern you. Return if develop chest pain, shortness of breath, new neurologic symptoms, or worsening symptoms.

## 2017-01-08 NOTE — ED Notes (Signed)
Patient denies pain and is resting comfortably.  

## 2017-01-08 NOTE — ED Provider Notes (Signed)
Pt seen and evaluated. D/W PA. Pt with 48 hours of Vertigo. No abnormal neuro findings, X subjective spinning with  Standing. CT with out change. Likely peripheral vertigo with no additional sx and no Ct change after 48 hours. Advise ENT follow-up if not improving. Meclizine. Walker and assistance at home until symptoms improving. ER with acute changes or worsening.   Tanna Furry, MD 01/08/17 [phone]

## 2017-01-08 NOTE — ED Triage Notes (Addendum)
Dizziness without n/v but has a  h/a since yesterday. Denies SOB or chest pain or visual changes . Upon shaking head has increase in h/a but no change in dizziness with movement

## 2017-01-08 NOTE — ED Notes (Addendum)
Pt assisted to standing and reports "just as dizzy as ever." No ataxia noted; ambulated a few feet.

## 2017-01-09 NOTE — ED Provider Notes (Signed)
[facility] Provider Note   CSN: [id number] Arrival date & time: 01/08/17  1241     History   Chief Complaint Chief Complaint  Patient presents with  . Dizziness    HPI Andres Murray is a 83 y.o. male.  Andres Murray is a 83 y.o. male with h/o anxiety, HLD, HTN presents to ED with complaint of dizziness onset approximately 2 days ago. Describes sensation as spinning. Intermittent in nature, precipitated with moving his head and standing. Does state he has some associated "head pain," but does not classify it as a headache. No head trauma, fever, URI sxs, trouble swallowing, visual disturbance, shortness of breath, chest pain, abdominal pain, N/V, facial droop, slurred speech, numbness, weakness, or loss of consciousness. No treatment tried PTA.       Past Medical History:  Diagnosis Date  . Anxiety   . Hyperlipemia   . Hypertension   . Persistent dry cough     Patient Active Problem List   Diagnosis Date Noted  . Mild cognitive impairment 11/03/2016  . Anxiety 09/06/2016  . Abnormal brain MRI 09/06/2016  . Chronic cough 09/06/2016    Past Surgical History:  Procedure Laterality Date  . KNEE SURGERY Right   . TONSILLECTOMY         Home Medications    Prior to Admission medications   Medication Sig Start Date End Date Taking? Authorizing Provider  amLODipine-valsartan (EXFORGE) 5-320 MG per tablet Take 1 tablet by mouth daily.   Yes Historical Provider, MD  atorvastatin (LIPITOR) 20 MG tablet Take 20 mg by mouth daily.   Yes Historical Provider, MD  clonazePAM (KLONOPIN) 0.5 MG tablet Take 1 tablet (0.5 mg total) by mouth at bedtime as needed. For insomnia 11/10/16  Yes Marcial Pacas, MD  nortriptyline (PAMELOR) 25 MG capsule Take 2 capsules (50 mg total) by mouth at bedtime. 09/06/16  Yes Marcial Pacas, MD  sertraline (ZOLOFT) 50 MG tablet Take 1 tablet (50 mg total) by mouth daily. 11/10/16  Yes Marcial Pacas, MD  esomeprazole (NEXIUM) 40 MG capsule Take 40 mg by  mouth daily before breakfast.    Historical Provider, MD  fluticasone (FLONASE) 50 MCG/ACT nasal spray as needed. 07/02/14   Historical Provider, MD  meclizine (ANTIVERT) 25 MG tablet Take 1 tablet (25 mg total) by mouth 3 (three) times daily as needed for dizziness. 01/08/17   Roxanna Mew, PA-C  metoprolol succinate (TOPROL-XL) 25 MG 24 hr tablet daily. 06/20/14   Historical Provider, MD    Family History Family History  Problem Relation Age of Onset  . Diabetes Mother   . Appendicitis Father     Social History Social History  Substance Use Topics  . Smoking status: Former Research scientist (life sciences)  . Smokeless tobacco: Never Used  . Alcohol use Yes     Comment: Occasional beer     Allergies   Patient has no known allergies.   Review of Systems Review of Systems  Constitutional: Negative for chills, diaphoresis and fever.  HENT: Negative for trouble swallowing.   Eyes: Negative for visual disturbance.  Respiratory: Negative for shortness of breath.   Cardiovascular: Negative for chest pain.  Gastrointestinal: Negative for abdominal pain, diarrhea, nausea and vomiting.  Musculoskeletal: Negative for arthralgias and myalgias.  Skin: Negative for rash.  Allergic/Immunologic: Negative for immunocompromised state.  Neurological: Positive for dizziness. Negative for syncope, facial asymmetry, speech difficulty, weakness and numbness.     Physical Exam Updated Vital Signs BP 133/67 (BP Location: Right Arm)  Pulse 76   Temp 98 F (36.7 C) (Oral)   Resp 18   Ht 5\' 5"  (1.651 m)   Wt 64 kg   SpO2 96%   BMI 23.46 kg/m   Physical Exam  Constitutional: He appears well-developed and well-nourished. No distress.  HENT:  Head: Normocephalic and atraumatic.  Right Ear: Tympanic membrane, external ear and ear canal normal.  Left Ear: Tympanic membrane, external ear and ear canal normal.  Mouth/Throat: Uvula is midline, oropharynx is clear and moist and mucous membranes are normal. No  trismus in the jaw.  No temporal tenderness.   Eyes: Conjunctivae and EOM are normal. Pupils are equal, round, and reactive to light. Right eye exhibits no discharge. Left eye exhibits no discharge. No scleral icterus.  No nystagmus.   Neck: Normal range of motion and phonation normal. Neck supple. No neck rigidity. Normal range of motion present.  Cardiovascular: Normal rate, regular rhythm, normal heart sounds and intact distal pulses.   No murmur heard. Pulmonary/Chest: Effort normal and breath sounds normal. No stridor. No respiratory distress. He has no wheezes.  Abdominal: Soft. He exhibits no distension. There is no tenderness. There is no rebound and no guarding.  Musculoskeletal: He exhibits no edema or tenderness.  Neurological: He is alert. He has normal strength. He is not disoriented. No sensory deficit. GCS eye subscore is 4. GCS verbal subscore is 5. GCS motor subscore is 6.  Mental Status:  Alert, thought content appropriate, able to give a coherent history. Speech fluent without evidence of aphasia. Able to follow 2 step commands without difficulty.  Cranial Nerves:  II:  Peripheral visual fields grossly normal, pupils equal, round, reactive to light III,IV, VI: ptosis not present, extra-ocular motions intact bilaterally  V,VII: smile symmetric, facial light touch sensation equal VIII: hearing grossly normal to voice  X: uvula elevates symmetrically  XI: bilateral shoulder shrug symmetric and strong XII: midline tongue extension without fassiculations Motor:  Normal tone. 5/5 in upper and lower extremities bilaterally including strong and equal grip strength and dorsiflexion/plantar flexion Sensory: light touch normal in all extremities. Cerebellar: normal finger-to-nose with bilateral upper extremities Gait: ambulatory CV: distal pulses palpable throughout   Skin: Skin is warm and dry. He is not diaphoretic.  Psychiatric: He has a normal mood and affect. His behavior is  normal.     ED Treatments / Results  Labs (all labs ordered are listed, but only abnormal results are displayed) Labs Reviewed - No data to display  EKG  EKG Interpretation  Date/Time:  Saturday January 08 2017 12:55:04 EST Ventricular Rate:  64 PR Interval:  138 QRS Duration: 104 QT Interval:  398 QTC Calculation: 410 R Axis:   53 Text Interpretation:  Sinus rhythm with Premature atrial complexes Otherwise normal ECG Confirmed by Jeneen Rinks  MD, [place] ([id number]) on 01/08/2017 1:18:21 PM       Radiology Ct Head Wo Contrast  Result Date: 01/08/2017 CLINICAL DATA:  Dizziness.  Nausea vomiting and headache. EXAM: CT HEAD WITHOUT CONTRAST TECHNIQUE: Contiguous axial images were obtained from the base of the skull through the vertex without intravenous contrast. COMPARISON:  MRI from 10/11/2016 FINDINGS: Brain: Low attenuation throughout the subcortical and periventricular white matter is identified compatible with chronic small vessel ischemic disease. 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 No evidence for acute brain infarct. No intracranial hemorrhage or mass noted. Vascular:  No hyperdense vessel or unexpected calcification. Skull: The calvarium appears intact. Sinuses/Orbits: The paranasal sinuses and mastoid air cells are clear. No acute finding. Other: None. IMPRESSION: 1. No acute intracranial abnormalities. 2. Chronic microvascular disease and brain atrophy. Electronically Signed   By: Kerby Moors M.D.   On: 01/08/2017 14:27    Procedures Procedures (including critical care time)  Medications Ordered in ED Medications  meclizine (ANTIVERT) tablet 25 mg (25 mg Oral Given 01/08/17 1416)     Initial Impression / Assessment and Plan / ED Course  I have reviewed the triage vital signs and the nursing notes.  Pertinent labs & imaging results that were  available during my care of the patient were reviewed by me and considered in my medical decision making (see chart for details).     Patient presents to ED with complaint of intermittent dizziness onset approximately 2 days. Denies head trauma, visual disturbance, facial droop, slurred speech, numbness, weakness, chest pain, or shortness of breath. Patient is afebrile and non-toxic appearing in NAD. VSS. Heart RRR. Lungs CTABL. Abdomen soft, non-tender. No focal neuro deficits on exam. Will CT head to evaluate for central pathology as well as EKG. Meclizine given.   EKG shows sinus rhythm with PACs, otherwise normal. CT shows no acute intracranial hemorrhage, infarction, or masses; chronic microvascular disease and atrophy noted. Shared visit with Dr. Jeneen Rinks, who also evaluated patient. Given duration of symptoms, intermittent in nature, no focal deficits on exam, and CT findings suspect peripheral cause. Discussed results and plan with family. Rx meclizine. Follow up with ENT on Monday for further assessment and management, contact information provided. Strict ED precautions given. Patient and family voiced understanding and is agreeable.   Final Clinical Impressions(s) / ED Diagnoses   Final diagnoses:  Dizziness    New Prescriptions Discharge Medication List as of 01/08/2017  4:07 PM    START taking these medications   Details  meclizine (ANTIVERT) 25 MG tablet Take 1 tablet (25 mg total) by mouth 3 (three) times daily as needed for dizziness., Starting Sat 01/08/2017, Print         [place], [place] 01/09/17 Glena Norfolk, MD 01/21/17 [phone]

## 2017-03-14 ENCOUNTER — Telehealth: Payer: Self-pay | Admitting: Neurology

## 2017-03-14 NOTE — Telephone Encounter (Signed)
Left message for a return call to schedule an appt.

## 2017-03-14 NOTE — Telephone Encounter (Signed)
Patient called office due to having right hand numbness going on for 2 days.  Offered this week with NP Cecille Rubin per patient he is unable to appointment due to other appointments.  Patient requesting to be seen ASAP.  Please call

## 2017-03-14 NOTE — Telephone Encounter (Signed)
Patient called back and scheduled an appt.

## 2017-03-24 ENCOUNTER — Encounter: Payer: Self-pay | Admitting: Neurology

## 2017-03-24 ENCOUNTER — Telehealth: Payer: Self-pay | Admitting: Neurology

## 2017-03-24 ENCOUNTER — Ambulatory Visit (INDEPENDENT_AMBULATORY_CARE_PROVIDER_SITE_OTHER): Payer: Medicare Other | Admitting: Neurology

## 2017-03-24 VITALS — BP 168/93 | HR 81 | Ht 65.0 in | Wt 138.0 lb

## 2017-03-24 DIAGNOSIS — F419 Anxiety disorder, unspecified: Secondary | ICD-10-CM | POA: Diagnosis not present

## 2017-03-24 DIAGNOSIS — G3184 Mild cognitive impairment, so stated: Secondary | ICD-10-CM | POA: Diagnosis not present

## 2017-03-24 DIAGNOSIS — R9089 Other abnormal findings on diagnostic imaging of central nervous system: Secondary | ICD-10-CM

## 2017-03-24 DIAGNOSIS — R202 Paresthesia of skin: Secondary | ICD-10-CM

## 2017-03-24 NOTE — Progress Notes (Signed)
PATIENT: Andres Murray DOB: 1934-02-02  Chief Complaint  Patient presents with  . Andres Murray    He was referred here to discuss his symptoms of daily anxiety and nervousness.  States he has also developed a persistant, dry cough over the last six months.  [name] PCP    [name], MD     HISTORICAL  Andres Murray is a 83 years old right-handed male, seen in refer by his primary care [name] for evaluation of anxiety, frequent dry cough, initial evaluation was on September 06 2016.   I reviewed and summarized the referral note, he had a history of hyperlipidemia, hypertension, has been on combination of amlodipine, valsartan treatment for more than 10 years, he is going through a lot of stress recently, he is going to move out of his house to assistant living around January 2018, he has to give up his dog, his wife suffered memory loss, he was recently diagnosed with prostate cancer in August 2017, will have treatment soon.  Around spring of 2017, he began to notice worsening anxiety, feeling nervousness, frequent dry cough, body movement, difficulty falling to sleep, over past few months, he has been taking Tylenol or Advil PM, which helped her symptoms some.  During interview, he was noticed to have frequent dry cough, shoulder shrugging body twisting, tic-like abnormal movement, he denied previous history of tic  He denies visual loss, no significant memory loss, he is a retired school principal at age 34, still enjoys reading,  We have personally reviewed MRI of the brain without contrast in 03/12/14, extensive periventricular white matter disease consistent with small vessel disease, incidental finding of quadrigeminal plate lipoma  UPDATE Nov 03 2016: He is now taking nortriptyline 50mg  qhs, which has helped him sleep, but he still has difficulty sleepiness.  During the day, he still has frequent body jerking, clear his throat, he complains of depression anxiety, chronic  insomnia,  We have personally reviewed MRI scan on October 11 2016, moderate perisylvian atrophy, mild to moderate periventricular and subcortical chronic small vessel disease, mild progression compared to previous scan in Mar 12, 2014  UPDATE Mar 24 2017: He had prostate cancer, he had seeding procedure, also suffered kidney stone, had a recent procedure, he wore Foley cath today, he has moved to independent living at [facility], his wife passed away in 03/12/2017, he is tearful,  Complains of worsening anxiety, motor and vocal tics, difficulty sleeping, he is also confused about the medication he is taking, complains of dizziness, dry mouth,  He also complains few weeks history of intermittent right hand paresthesia mainly involving fourth fifth fingers, radiating to right arm, he denies neck pain,  REVIEW OF SYSTEMS: Full 14 system review of systems performed and notable only for: Anxiety, depression, insomnia  ALLERGIES: No Known Allergies  HOME MEDICATIONS: Current Outpatient Prescriptions  Medication Sig Dispense Refill  . amLODipine-valsartan (EXFORGE) 5-320 MG per tablet Take 1 tablet by mouth daily.    [name] atorvastatin (LIPITOR) 20 MG tablet Take 20 mg by mouth daily.    . clonazePAM (KLONOPIN) 0.5 MG tablet Take 1 tablet (0.5 mg total) by mouth at bedtime as needed. For insomnia 30 tablet 4  . esomeprazole (NEXIUM) 40 MG capsule Take 40 mg by mouth daily before breakfast.    . fluticasone (FLONASE) 50 MCG/ACT nasal spray as needed.    . meclizine (ANTIVERT) 25 MG tablet Take 1 tablet (25 mg total) by mouth 3 (three) times daily as needed for  dizziness. 21 tablet 0  . metoprolol succinate (TOPROL-XL) 25 MG 24 hr tablet daily.    . nortriptyline (PAMELOR) 25 MG capsule Take 2 capsules (50 mg total) by mouth at bedtime. 60 capsule 11  . sertraline (ZOLOFT) 50 MG tablet Take 1 tablet (50 mg total) by mouth daily. 30 tablet 11   No current facility-administered medications for this visit.      PAST MEDICAL HISTORY: Past Medical History:  Diagnosis Date  . Anxiety   . Hyperlipemia   . Hypertension   . Persistent dry cough     PAST SURGICAL HISTORY: Past Surgical History:  Procedure Laterality Date  . KNEE SURGERY Right   . TONSILLECTOMY      FAMILY HISTORY: Family History  Problem Relation Age of Onset  . Diabetes Mother   . Appendicitis Father     SOCIAL HISTORY:  Social History   Social History  . Marital status: Married    Spouse name: N/A  . Number of children: 2  . Years of education: Doctorate   Occupational History  . Retired    Social History Main Topics  . Smoking status: Former Research scientist (life sciences)  . Smokeless tobacco: Never Used  . Alcohol use Yes     Comment: Occasional beer  . Drug use: No  . Sexual activity: Not on file   Other Topics Concern  . Not on file   Social History Narrative   Lives at home with wife, Andres Murray.   Right-handed.   4-5 cups caffeine daily.     PHYSICAL EXAM   Vitals:   03/24/17 0911  BP: (!) 168/93  Pulse: 81  Weight: 138 lb (62.6 kg)  Height: 5\' 5"  (1.651 m)    Not recorded      Body mass index is 22.96 kg/m.  PHYSICAL EXAMNIATION:  Gen: NAD, conversant, well nourised, obese, well groomed                     Cardiovascular: Regular rate rhythm, no peripheral edema, warm, nontender. Eyes: Conjunctivae clear without exudates or hemorrhage Neck: Supple, no carotid bruits. Pulmonary: Clear to auscultation bilaterally   NEUROLOGICAL EXAM:  MENTAL STATUS:Anxious looking elderly male, with frequent dry cough, body movement, shoulder shrugging, suggestive of motor tics, Speech:    Speech is normal; fluent and spontaneous with normal comprehension.  Cognition:     Orientation to time, place and person     Normal recent and remote memory     Normal Attention span and concentration     Normal Language, naming, repeating,spontaneous speech     Fund of knowledge   CRANIAL NERVES: CN II: Visual fields  are full to confrontation. Fundoscopic exam is normal with sharp discs and no vascular changes. Pupils are round equal and briskly reactive to light. CN III, IV, VI: extraocular movement are normal. No ptosis. CN V: Facial sensation is intact to pinprick in all 3 divisions bilaterally. Corneal responses are intact.  CN VII: Face is symmetric with normal eye closure and smile. CN VIII: Hearing is normal to rubbing fingers CN IX, X: Palate elevates symmetrically. Phonation is normal. CN XI: Head turning and shoulder shrug are intact CN XII: Tongue is midline with normal movements and no atrophy.  MOTOR: There is no pronator drift of out-stretched arms. Muscle bulk and tone are normal. Muscle strength is normal.  REFLEXES: Reflexes are 2+ and symmetric at the biceps, triceps, knees, and ankles. Plantar responses are flexor.  SENSORY: Intact to light  touch, pinprick, positional sensation and vibratory sensation are intact in fingers and toes.  COORDINATION: Rapid alternating movements and fine finger movements are intact. There is no dysmetria on finger-to-nose and heel-knee-shin.    GAIT/STANCE: Need to push up from seated position, mildly cautious, wearing Foley catheters with bloody draine today Romberg is absent.   DIAGNOSTIC DATA (LABS, IMAGING, TESTING) - I reviewed patient records, labs, notes, testing and imaging myself where available.   ASSESSMENT AND PLAN  Seneca Hoback is a 83 y.o. male  Mild cognitive impairment  We have personally reviewed MRI of the brain in November 2017, mild generalized atrophy, supratentorium small vessel disease  Worsening anxiety, motor and vocal tics, chronic insomnia   I have asked him to bring all his medication bottle for [place] to go over his medications,  He may take melatonin, and clonazepam 0.5 milligram as nighttime for sleep  New-onset right hand paresthesia  Right carpal tunnel syndrome versus right cervical radiculopathy  EMG nerve  conduction study  Marcial Pacas, M.D. Ph.D.  [facility] [address] [place], [place] [address] Ph: [phone] Fax: [phone]  CC: Referring Provider

## 2017-03-24 NOTE — Telephone Encounter (Signed)
I have asked him to come back Monday Mar 28 2017 without appointment for [place] to go over his medications

## 2017-03-28 ENCOUNTER — Encounter: Payer: Self-pay | Admitting: *Deleted

## 2017-04-29 ENCOUNTER — Ambulatory Visit (INDEPENDENT_AMBULATORY_CARE_PROVIDER_SITE_OTHER): Payer: Medicare Other | Admitting: Neurology

## 2017-04-29 ENCOUNTER — Encounter (INDEPENDENT_AMBULATORY_CARE_PROVIDER_SITE_OTHER): Payer: Self-pay | Admitting: Neurology

## 2017-04-29 DIAGNOSIS — R202 Paresthesia of skin: Secondary | ICD-10-CM | POA: Diagnosis not present

## 2017-04-29 DIAGNOSIS — Z0289 Encounter for other administrative examinations: Secondary | ICD-10-CM

## 2017-04-29 DIAGNOSIS — R9089 Other abnormal findings on diagnostic imaging of central nervous system: Secondary | ICD-10-CM

## 2017-04-29 DIAGNOSIS — G3184 Mild cognitive impairment, so stated: Secondary | ICD-10-CM

## 2017-04-29 DIAGNOSIS — F419 Anxiety disorder, unspecified: Secondary | ICD-10-CM

## 2017-04-29 NOTE — Procedures (Signed)
        Full Name: Andres Murray Gender: Male MRN #: [id number] Date of Birth: [birth year]    Visit Date: 04/29/17 11:28 Age: 83 Years 58 Months Old Examining Physician: Marcial Pacas, MD  Referring Physician: Krista Blue, MD History: 83 years old with intermittent right hand paresthesia   Summary of the test: Nerve conduction study: Right median ulnar sensory and motor responses were normal.  Electromyography: Selected needle examination was performed at right upper extremity muscles right cervical paraspinal muscles that was normal.   Conclusion: This is a normal study, there is no electrodiagnostic evidence of right upper extremity neuropathy or right cervical radiculopathy.    ------------------------------- Marcial Pacas, M.D.  [facility] [place], [place] [address] Tel: [phone] Fax: [phone]        [facility]    Nerve / Sites Muscle Latency Ref. Amplitude Ref. Rel Amp Segments Distance Velocity Ref. Area    ms ms mV mV %  cm m/s m/s mVms  R Median - APB     Wrist APB 3.5 ?4.4 4.2 ?4.0 100 Wrist - APB 7   16.6     Upper arm APB 7.5  3.8  89.1 Upper arm - Wrist 20 50 ?49 15.2  R Ulnar - ADM     Wrist ADM 2.4 ?3.3 8.1 ?6.0 100 Wrist - ADM 7   25.6     B.Elbow ADM 5.8  6.7  82.7 B.Elbow - Wrist 18 53 ?49 24.1     A.Elbow ADM 7.4  6.5  97.3 A.Elbow - B.Elbow 9 56 ?49 24.2         A.Elbow - Wrist             SNC    Nerve / Sites Rec. Site Peak Lat Ref.  Amp Ref. Segments Distance Peak Diff Ref.    ms ms V V  cm ms ms  R Median, Ulnar - Transcarpal comparison     Median Palm Wrist 2.3 ?2.2 20 ?35 Median Palm - Wrist 8       Ulnar Palm Wrist 2.0 ?2.2 11 ?12 Ulnar Palm - Wrist 8          Median Palm - Ulnar Palm  0.3 ?0.4  R Median - Orthodromic (Dig II, Mid palm)     Dig II Wrist 3.2 ?3.4 10 ?10 Dig II - Wrist 13    R Ulnar - Orthodromic, (Dig V, Mid palm)     Dig V Wrist 2.6 ?3.1 5 ?5 Dig V - Wrist 52             F  Wave    Nerve F Lat Ref.   ms ms  R Ulnar - ADM 26.9 ?32.0       EMG full       EMG Summary Table    Spontaneous MUAP Recruitment  Muscle IA Fib PSW Fasc Other Amp Dur. Poly Pattern  R. First dorsal interosseous Normal None None None _______ Normal Normal Normal Normal  R. Pronator teres Normal None None None _______ Normal Normal Normal Normal  R. Biceps brachii Normal None None None _______ Normal Normal Normal Normal  R. Deltoid Normal None None None _______ Normal Normal Normal Normal  R. Triceps brachii Normal None None None _______ Normal Normal Normal Normal  R. Cervical paraspinals Normal None None None _______ Normal Normal Normal Normal

## 2017-05-04 ENCOUNTER — Ambulatory Visit: Payer: Medicare Other | Admitting: Nurse Practitioner

## 2017-09-26 ENCOUNTER — Encounter: Payer: Self-pay | Admitting: Neurology

## 2017-09-26 ENCOUNTER — Ambulatory Visit (INDEPENDENT_AMBULATORY_CARE_PROVIDER_SITE_OTHER): Payer: Medicare Other | Admitting: Neurology

## 2017-09-26 VITALS — BP 158/73 | HR 56 | Ht 65.0 in | Wt 140.0 lb

## 2017-09-26 DIAGNOSIS — F03A0 Unspecified dementia, mild, without behavioral disturbance, psychotic disturbance, mood disturbance, and anxiety: Secondary | ICD-10-CM

## 2017-09-26 DIAGNOSIS — F0390 Unspecified dementia without behavioral disturbance: Secondary | ICD-10-CM | POA: Diagnosis not present

## 2017-09-26 DIAGNOSIS — F419 Anxiety disorder, unspecified: Secondary | ICD-10-CM | POA: Diagnosis not present

## 2017-09-26 MED ORDER — DONEPEZIL HCL 10 MG PO TABS: 10.0000 mg | ORAL_TABLET | Freq: Every day | ORAL | 11 refills | Status: DC

## 2017-09-26 MED ORDER — MEMANTINE HCL 10 MG PO TABS
10.0000 mg | ORAL_TABLET | Freq: Two times a day (BID) | ORAL | 11 refills | Status: DC
Start: 2017-09-26 — End: 2018-03-30

## 2017-09-26 NOTE — Progress Notes (Signed)
PATIENT: Andres Murray DOB: Dec 30, 1933  Chief Complaint  Patient presents with  . Andres Murray    He was referred here to discuss his symptoms of daily anxiety and nervousness.  States he has also developed a persistant, dry cough over the last six months.  [name] PCP    [name], MD     HISTORICAL  Andres Murray is a 83 years old right-handed male, seen in refer by his primary care [place] for evaluation of anxiety, frequent dry cough, initial evaluation was on September 06 2016.   I reviewed and summarized the referral note, he had a history of hyperlipidemia, hypertension, has been on combination of amlodipine, valsartan treatment for more than 10 years, he is going through a lot of stress recently, he is going to move out of his house to assistant living around January 2018, he has to give up his dog, his wife suffered memory loss, he was recently diagnosed with prostate cancer in August 2017, will have treatment soon.  Around spring of 2017, he began to notice worsening anxiety, feeling nervousness, frequent dry cough, body movement, difficulty falling to sleep, over past few months, he has been taking Tylenol or Advil PM, which helped her symptoms some.  During interview, he was noticed to have frequent dry cough, shoulder shrugging body twisting, tic-like abnormal movement, he denied previous history of tic  He denies visual loss, no significant memory loss, he is a retired school principal at age 57, still enjoys reading,  We have personally reviewed MRI of the brain without contrast in 21-Feb-2014, extensive periventricular white matter disease consistent with small vessel disease, incidental finding of quadrigeminal plate lipoma  UPDATE Nov 03 2016: He is now taking nortriptyline 50mg  qhs, which has helped him sleep, but he still has difficulty sleepiness.  During the day, he still has frequent body jerking, clear his throat, he complains of depression anxiety, chronic  insomnia,  We have personally reviewed MRI scan on October 11 2016, moderate perisylvian atrophy, mild to moderate periventricular and subcortical chronic small vessel disease, mild progression compared to previous scan in Feb 21, 2014  UPDATE Mar 24 2017: He had prostate cancer, he had seeding procedure, also suffered kidney stone, had a recent procedure, he wore Foley cath today, he has moved to independent living at [facility], his wife passed away in 2017-02-21, he is tearful,  Complains of worsening anxiety, motor and vocal tics, difficulty sleeping, he is also confused about the medication he is taking, complains of dizziness, dry mouth,  He also complains few weeks history of intermittent right hand paresthesia mainly involving fourth fifth fingers, radiating to right arm, he denies neck pain,  UPDATE Sep 26 2017: We have reviewed MRI brain in Nov 2017, mild atrophy, small vessel disease.  He now lives at independent living, with worsening memory loss, drives to clinic today.  He takes melatonin for insominia.   REVIEW OF SYSTEMS: Full 14 system review of systems performed and notable only for: frequent urinations  ALLERGIES: Allergies  Allergen Reactions  . Nortriptyline Other (See Comments)    Extreme dry mouth    HOME MEDICATIONS: Current Outpatient Medications  Medication Sig Dispense Refill  . amLODipine-valsartan (EXFORGE) 5-320 MG per tablet Take 1 tablet by mouth daily.    [name] atorvastatin (LIPITOR) 40 MG tablet Take 40 mg by mouth daily.    . clonazePAM (KLONOPIN) 0.5 MG tablet Take 1 tablet (0.5 mg total) by mouth at bedtime as needed. For insomnia  30 tablet 4  . esomeprazole (NEXIUM) 40 MG capsule Take 40 mg by mouth as needed (heartburn/reflux).     . fluticasone (FLONASE) 50 MCG/ACT nasal spray as needed.    [name] HYDROcodone-acetaminophen (NORCO/VICODIN) 5-325 MG tablet Take 1 tablet by mouth as needed for moderate pain.    . meclizine (ANTIVERT) 12.5 MG tablet Take 12.5 mg  by mouth as needed for dizziness.    . metoCLOPramide (REGLAN) 10 MG tablet Take 10 mg by mouth as needed for nausea.    . metoprolol succinate (TOPROL-XL) 25 MG 24 hr tablet Take 25 mg by mouth daily.     [name] oxybutynin (DITROPAN) 5 MG tablet Take 5 mg by mouth 3 (three) times daily.    . phenazopyridine (PYRIDIUM) 200 MG tablet Take 200 mg by mouth 3 (three) times daily as needed for pain.    [name] sertraline (ZOLOFT) 50 MG tablet Take 50 mg by mouth daily.    . tamsulosin (FLOMAX) 0.4 MG CAPS capsule Take 0.4 mg by mouth daily after supper.     No current facility-administered medications for this visit.     PAST MEDICAL HISTORY: Past Medical History:  Diagnosis Date  . Anxiety   . Hyperlipemia   . Hypertension   . Persistent dry cough     PAST SURGICAL HISTORY: Past Surgical History:  Procedure Laterality Date  . KNEE SURGERY Right   . TONSILLECTOMY      FAMILY HISTORY: Family History  Problem Relation Age of Onset  . Diabetes Mother   . Appendicitis Father     SOCIAL HISTORY:  Social History   Socioeconomic History  . Marital status: Married    Spouse name: Not on file  . Number of children: 2  . Years of education: Doctorate  . Highest education level: Not on file  Social Needs  . Financial resource strain: Not on file  . Food insecurity - worry: Not on file  . Food insecurity - inability: Not on file  . Transportation needs - medical: Not on file  . Transportation needs - non-medical: Not on file  Occupational History  . Occupation: Retired  Tobacco Use  . Smoking status: Former Research scientist (life sciences)  . Smokeless tobacco: Never Used  Substance and Sexual Activity  . Alcohol use: Yes    Comment: Occasional beer  . Drug use: No  . Sexual activity: Not on file  Other Topics Concern  . Not on file  Social History Narrative   Lives at home with wife, Andres Murray.   Right-handed.   4-5 cups caffeine daily.     PHYSICAL EXAM   Vitals:   09/26/17 1056  BP: (!) 158/73    Pulse: (!) 56  Weight: 140 lb (63.5 kg)  Height: 5\' 5"  (1.651 m)    Not recorded      Body mass index is 23.3 kg/m.  PHYSICAL EXAMNIATION:  Gen: NAD, conversant, well nourised, obese, well groomed                     Cardiovascular: Regular rate rhythm, no peripheral edema, warm, nontender. Eyes: Conjunctivae clear without exudates or hemorrhage Neck: Supple, no carotid bruits. Pulmonary: Clear to auscultation bilaterally   NEUROLOGICAL EXAM:  MMSE - Mini Mental State Exam 09/26/2017  Orientation to time 5  Orientation to Place 5  Registration 3  Attention/ Calculation 2  Recall 0  Language- name 2 objects 2  Language- repeat 1  Language- follow 3 step command 3  Language-  read & follow direction 1  Write a sentence 1  Copy design 1  Total score 24    MENTAL STATUS:Anxious looking elderly male, with frequent dry cough, body movement, shoulder shrugging, suggestive of motor tics,    CRANIAL NERVES: CN II: Visual fields are full to confrontation. Fundoscopic exam is normal with sharp discs and no vascular changes. Pupils are round equal and briskly reactive to light. CN III, IV, VI: extraocular movement are normal. No ptosis. CN V: Facial sensation is intact to pinprick in all 3 divisions bilaterally. Corneal responses are intact.  CN VII: Face is symmetric with normal eye closure and smile. CN VIII: Hearing is normal to rubbing fingers CN IX, X: Palate elevates symmetrically. Phonation is normal. CN XI: Head turning and shoulder shrug are intact CN XII: Tongue is midline with normal movements and no atrophy.  MOTOR: There is no pronator drift of out-stretched arms. Muscle bulk and tone are normal. Muscle strength is normal.  REFLEXES: Reflexes are 2+ and symmetric at the biceps, triceps, knees, and ankles. Plantar responses are flexor.  SENSORY: Intact to light touch, pinprick, positional sensation and vibratory sensation are intact in fingers and  toes.  COORDINATION: Rapid alternating movements and fine finger movements are intact. There is no dysmetria on finger-to-nose and heel-knee-shin.    GAIT/STANCE: Need to push up from seated position, mildly cautious, wearing Foley catheters with bloody draine today Romberg is absent.   DIAGNOSTIC DATA (LABS, IMAGING, TESTING) - I reviewed patient records, labs, notes, testing and imaging myself where available.   ASSESSMENT AND PLAN  Roshard Rezabek is a 83 y.o. male  Mild cognitive impairment  MMSE 24/30  We have personally reviewed MRI of the brain in November 2017, mild generalized atrophy, supratentorium small vessel disease  Add on Aricept 10mg  daily, Namenda 10mg  bid  Laboratory evaluation for treatable etiology  Worsening anxiety, motor and vocal tics, chronic insomnia   He may take melatonin for insomnia   Marcial Pacas, M.D. Ph.D.  [facility] [address], [place], [place] [address] Ph: [phone] Fax: [phone]  CC: Referring Provider

## 2017-09-27 ENCOUNTER — Encounter: Payer: Self-pay | Admitting: *Deleted

## 2017-09-27 ENCOUNTER — Telehealth: Payer: Self-pay | Admitting: *Deleted

## 2017-09-27 LAB — COMPREHENSIVE METABOLIC PANEL
ALK PHOS: 65 IU/L (ref 39–117)
ALT: 23 IU/L (ref 0–44)
AST: 17 IU/L (ref 0–40)
Albumin/Globulin Ratio: 1.6 (ref 1.2–2.2)
Albumin: 4.4 g/dL (ref 3.5–4.7)
BUN/Creatinine Ratio: 16 (ref 10–24)
BUN: 16 mg/dL (ref 8–27)
Bilirubin Total: 0.5 mg/dL (ref 0.0–1.2)
CHLORIDE: 102 mmol/L (ref 96–106)
CO2: 27 mmol/L (ref 20–29)
CREATININE: 0.97 mg/dL (ref 0.76–1.27)
Calcium: 9.8 mg/dL (ref 8.6–10.2)
GFR calc Af Amer: 84 mL/min/{1.73_m2} (ref >59)
GFR calc non Af Amer: 73 mL/min/{1.73_m2} (ref >59)
GLUCOSE: 99 mg/dL (ref 65–99)
Globulin, Total: 2.7 g/dL (ref 1.5–4.5)
Potassium: 4.4 mmol/L (ref 3.5–5.2)
SODIUM: 144 mmol/L (ref 134–144)
Total Protein: 7.1 g/dL (ref 6.0–8.5)

## 2017-09-27 LAB — RPR: RPR: NONREACTIVE

## 2017-09-27 LAB — CBC WITH DIFFERENTIAL
BASOS ABS: 0 10*3/uL (ref 0.0–0.2)
Basos: 1 %
EOS (ABSOLUTE): 0.3 10*3/uL (ref 0.0–0.4)
Eos: 5 %
Hematocrit: 45.6 % (ref 37.5–51.0)
Hemoglobin: 14.7 g/dL (ref 13.0–17.7)
IMMATURE GRANULOCYTES: 0 %
Immature Grans (Abs): 0 10*3/uL (ref 0.0–0.1)
Lymphocytes Absolute: 1.6 10*3/uL (ref 0.7–3.1)
Lymphs: 28 %
MCH: 29.6 pg (ref 26.6–33.0)
MCHC: 32.2 g/dL (ref 31.5–35.7)
MCV: 92 fL (ref 79–97)
MONOS ABS: 0.6 10*3/uL (ref 0.1–0.9)
Monocytes: 10 %
Neutrophils Absolute: 3.1 10*3/uL (ref 1.4–7.0)
Neutrophils: 56 %
RBC: 4.96 x10E6/uL (ref 4.14–5.80)
RDW: 15.1 % (ref 12.3–15.4)
WBC: 5.5 10*3/uL (ref 3.4–10.8)

## 2017-09-27 LAB — VITAMIN D 25 HYDROXY (VIT D DEFICIENCY, FRACTURES): VIT D 25 HYDROXY: 45.2 ng/mL (ref 30.0–100.0)

## 2017-09-27 LAB — TSH: TSH: 1.11 u[IU]/mL (ref 0.450–4.500)

## 2017-09-27 LAB — VITAMIN B12: Vitamin B-12: 406 pg/mL (ref 232–1245)

## 2017-09-27 NOTE — Telephone Encounter (Signed)
I advised patient of normal labs per previous message.

## 2017-09-27 NOTE — Telephone Encounter (Signed)
-----   Message from Marcial Pacas, MD sent at 09/27/2017  8:53 AM EST ----- Please call patient for normal laboratory result

## 2017-09-27 NOTE — Telephone Encounter (Signed)
Patient's only listed number rang without an answer or machine to leave a message.  Mailed a letter, with results, to his home address.

## 2017-11-21 ENCOUNTER — Telehealth: Payer: Self-pay | Admitting: Neurology

## 2017-11-21 NOTE — Telephone Encounter (Signed)
Spoke to patient - reports vertigo was an issue prior to starting memantine and donepezil.  His symptoms worsen with sudden positional changes.  He is currently participating in vestibular rehab.  He also has an appt with cardiology this week.  He will continue this plan.  Says he may speak with his cardiologist about seeing an ENT again.

## 2017-11-21 NOTE — Telephone Encounter (Signed)
Patient states for the past 6 months he has had Vertigo and wants to know if this could be coming from memantine (NAMENDA) 10 MG tablet or donepezil (ARICEPT) 10 MG tablet.

## 2018-01-14 ENCOUNTER — Emergency Department (HOSPITAL_COMMUNITY)
Admission: EM | Admit: 2018-01-14 | Discharge: 2018-01-14 | Disposition: A | Discharge status: Home / Self Care | Payer: Medicare Other | Attending: Emergency Medicine | Admitting: Emergency Medicine

## 2018-01-14 ENCOUNTER — Encounter (HOSPITAL_COMMUNITY): Payer: Self-pay | Admitting: Emergency Medicine

## 2018-01-14 DIAGNOSIS — Z79899 Other long term (current) drug therapy: Secondary | ICD-10-CM | POA: Diagnosis not present

## 2018-01-14 DIAGNOSIS — I10 Essential (primary) hypertension: Secondary | ICD-10-CM | POA: Diagnosis not present

## 2018-01-14 DIAGNOSIS — Z87891 Personal history of nicotine dependence: Secondary | ICD-10-CM | POA: Insufficient documentation

## 2018-01-14 DIAGNOSIS — R339 Retention of urine, unspecified: Secondary | ICD-10-CM | POA: Insufficient documentation

## 2018-01-14 DIAGNOSIS — F419 Anxiety disorder, unspecified: Secondary | ICD-10-CM | POA: Insufficient documentation

## 2018-01-14 DIAGNOSIS — Z7982 Long term (current) use of aspirin: Secondary | ICD-10-CM | POA: Insufficient documentation

## 2018-01-14 LAB — URINALYSIS, ROUTINE W REFLEX MICROSCOPIC
Bilirubin Urine: NEGATIVE
Glucose, UA: NEGATIVE mg/dL
Ketones, ur: 5 mg/dL — AB
Leukocytes, UA: NEGATIVE
Nitrite: NEGATIVE
Protein, ur: NEGATIVE mg/dL
Specific Gravity, Urine: 1.012 (ref 1.005–1.030)
Squamous Epithelial / LPF: NONE SEEN
pH: 7 (ref 5.0–8.0)

## 2018-01-14 LAB — I-STAT CHEM 8, ED
BUN: 16 mg/dL (ref 6–20)
Calcium, Ion: 1.21 mmol/L (ref 1.15–1.40)
Chloride: 101 mmol/L (ref 101–111)
Creatinine, Ser: 0.8 mg/dL (ref 0.61–1.24)
Glucose, Bld: 127 mg/dL — ABNORMAL HIGH (ref 65–99)
HEMATOCRIT: 41 % (ref 39.0–52.0)
HEMOGLOBIN: 13.9 g/dL (ref 13.0–17.0)
POTASSIUM: 4.4 mmol/L (ref 3.5–5.1)
SODIUM: 139 mmol/L (ref 135–145)
TCO2: 27 mmol/L (ref 22–32)

## 2018-01-14 MED ORDER — LORAZEPAM 1 MG PO TABS
1.0000 mg | ORAL_TABLET | Freq: Once | ORAL | Status: AC
  Administered 2018-01-14: 1 mg via ORAL
  Filled 2018-01-14: qty 1

## 2018-01-14 MED ORDER — LIDOCAINE HCL 2 % EX GEL
1.0000 "application " | Freq: Once | CUTANEOUS | Status: AC
  Administered 2018-01-14: 1 via URETHRAL
  Filled 2018-01-14: qty 5

## 2018-01-14 MED ORDER — OXYCODONE-ACETAMINOPHEN 5-325 MG PO TABS
1.0000 | ORAL_TABLET | Freq: Once | ORAL | Status: AC
  Administered 2018-01-14: 1 via ORAL
  Filled 2018-01-14: qty 1

## 2018-01-14 NOTE — Procedures (Addendum)
Foley Catheter Placement Note  Indications: 83 y.o. 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 Here with [facility].   Pre-operative Diagnosis: Urinary retention  Post-operative Diagnosis: Same  Surgeon: Stasia Cavalier, MD  Assistants: None  Procedure Details  Patient was placed in the supine position, prepped with Betadine and draped in the usual sterile fashion.  We injected lidocaine jelly per urethra prior to the procedure. Initially, I felt resistance in the fossa navicularis, but was able to push through with the nose of the Lidojet syringe. Afterwards, I inserted a 14 [place] 2-way catheter per urethra which easily passed into the bladder with moderate resistance at the prostatic urethra.  We achieved return of clear yellow urine and then proceeded to insert 10 mL of sterile water into the Foley balloon.  The catheter was attached to a drainage bag and secured with a StatLock.  Placement of the catheter had return of greater than 600 mL of clear yellow urine.               - Patient to follow up with Dr. Gloriann Loan was as scheduled on 01/19/18.   I attended on this case and agree with the above note.

## 2018-01-14 NOTE — ED Notes (Signed)
Bladder Scan 600ml 

## 2018-01-14 NOTE — ED Notes (Signed)
OR called for urology cart to place at bedside.

## 2018-01-14 NOTE — ED Provider Notes (Signed)
[facility] DEPT Provider Note   CSN: [id number] Arrival date & time: 01/14/18  1407     History   Chief Complaint Chief Complaint  Patient presents with  . Urinary Retention    HPI Andres Murray is a 84 y.o. male.  HPI   8yM with urinary retention. Last urinated around 2pm yesterday. Increasing suprapubic pain since then. Several attempts to insert urinary catheter unsuccessful by nursing prior to my evaluation.  Hx of prostate CA s/p TURP 03/2016, anterior urethral stricture.  Bladder prostatic urethral calculi and noted to have scarring of bladder neck prostatic urethra on cystoscopy 03/2017. Urologist Dr Harlow Asa in [facility].        Past Medical History:  Diagnosis Date  . Anxiety   . Hyperlipemia   . Hypertension   . Persistent dry cough     Patient Active Problem List   Diagnosis Date Noted  . Mild dementia 09/26/2017  . Paresthesia 03/24/2017  . Mild cognitive impairment 11/03/2016  . Anxiety 09/06/2016  . Abnormal brain MRI 09/06/2016  . Chronic cough 09/06/2016    Past Surgical History:  Procedure Laterality Date  . KNEE SURGERY Right   . TONSILLECTOMY         Home Medications    Prior to Admission medications   Medication Sig Start Date End Date Taking? Authorizing Provider  amLODipine-valsartan (EXFORGE) 5-320 MG per tablet Take 1 tablet by mouth daily.    [provider]  aspirin EC 81 MG tablet Take 81 mg daily by mouth.    [provider]  atorvastatin (LIPITOR) 40 MG tablet Take 40 mg by mouth daily.    [provider]  donepezil (ARICEPT) 10 MG tablet Take 1 tablet (10 mg total) at bedtime by mouth. 09/26/17   Marcial Pacas, MD  esomeprazole (NEXIUM) 40 MG capsule Take 40 mg by mouth as needed (heartburn/reflux).     [provider]  fluticasone (FLONASE) 50 MCG/ACT nasal spray as needed. 07/02/14   [provider]  HYDROcodone-acetaminophen (NORCO/VICODIN) 5-325 MG  tablet Take 1 tablet by mouth as needed for moderate pain.    [provider]  meclizine (ANTIVERT) 12.5 MG tablet Take 12.5 mg by mouth as needed for dizziness.    [provider]  memantine (NAMENDA) 10 MG tablet Take 1 tablet (10 mg total) 2 (two) times daily by mouth. 09/26/17   Marcial Pacas, MD  metoCLOPramide (REGLAN) 10 MG tablet Take 10 mg by mouth as needed for nausea.    [provider]  metoprolol succinate (TOPROL-XL) 25 MG 24 hr tablet Take 25 mg by mouth daily.  06/20/14   [provider]  oxybutynin (DITROPAN) 5 MG tablet Take 5 mg by mouth 3 (three) times daily.    [provider]  phenazopyridine (PYRIDIUM) 200 MG tablet Take 200 mg by mouth 3 (three) times daily as needed for pain.    [provider]  tamsulosin (FLOMAX) 0.4 MG CAPS capsule Take 0.4 mg by mouth daily after supper.    [provider]    Family History Family History  Problem Relation Age of Onset  . Diabetes Mother   . Appendicitis Father     Social History Social History   Tobacco Use  . Smoking status: Former Research scientist (life sciences)  . Smokeless tobacco: Never Used  Substance Use Topics  . Alcohol use: Yes    Comment: Occasional beer  . Drug use: No     Allergies   Nortriptyline  Review of Systems Review of Systems  All systems reviewed and negative, other than as noted in HPI.  Physical Exam Updated Vital Signs BP (!) 184/72 (BP Location: Right Arm)   Pulse 95   Temp 98.1 F (36.7 C) (Oral)   Resp 18   SpO2 100%   Physical Exam  Constitutional: He appears well-developed and well-nourished.  Laying in bed.  Appears uncomfortable.  HENT:  Head: Normocephalic and atraumatic.  Eyes: Conjunctivae are normal. Right eye exhibits no discharge. Left eye exhibits no discharge.  Neck: Neck supple.  Cardiovascular: Normal rate, regular rhythm and normal heart sounds. Exam reveals no gallop and no friction rub.  No murmur  heard. Pulmonary/Chest: Effort normal and breath sounds normal. No respiratory distress.  Abdominal: Soft. He exhibits no distension. There is tenderness.  Significant suprapubic tenderness  Musculoskeletal: He exhibits no edema or tenderness.  Neurological: He is alert.  Skin: Skin is warm and dry.  Psychiatric: He has a normal mood and affect. His behavior is normal. Thought content normal.  Nursing note and vitals reviewed.    ED Treatments / Results  Labs (all labs ordered are listed, but only abnormal results are displayed) Labs Reviewed  URINALYSIS, ROUTINE W REFLEX MICROSCOPIC  I-STAT CHEM 8, ED    EKG  EKG Interpretation None       Radiology No results found.  Procedures Procedures (including critical care time)  Medications Ordered in ED Medications - No data to display   Initial Impression / Assessment and Plan / ED Course  I have reviewed the triage vital signs and the nursing notes.  Pertinent labs & imaging results that were available during my care of the patient were reviewed by me and considered in my medical decision making (see chart for details).  84 year old male with urinary retention.  Attempted to place catheter by nursing without success.  Urology was consulted for catheter placement.  Final Clinical Impressions(s) / ED Diagnoses   Final diagnoses:  Urinary retention    ED Discharge Orders    None       Virgel Manifold, MD 01/15/18 1526

## 2018-01-14 NOTE — ED Triage Notes (Signed)
Patient c/o urinary retention, last urinated yesterday at 1400. Hx prostate cancer and kidney stones. No active treatments at this time.

## 2018-01-14 NOTE — ED Provider Notes (Signed)
Assumed care from Dr. Wilson Singer.  Briefly, the patient is an 84 year old male with history of prostate issues here with urinary retention.  Urology was consulted and placed a Foley.  Creatinine is baseline.  Urinalysis without signs of UTI.  Discharge with outpatient urology follow-up.  Patient is otherwise without complaints and well-appearing with stable vital signs.   Duffy Bruce, MD 01/14/18 [phone]

## 2018-01-16 ENCOUNTER — Emergency Department (HOSPITAL_COMMUNITY)
Admission: EM | Admit: 2018-01-16 | Discharge: 2018-01-16 | Disposition: A | Discharge status: Home / Self Care | Payer: Medicare Other | Attending: Emergency Medicine | Admitting: Emergency Medicine

## 2018-01-16 ENCOUNTER — Encounter (HOSPITAL_COMMUNITY): Payer: Self-pay | Admitting: Emergency Medicine

## 2018-01-16 DIAGNOSIS — I10 Essential (primary) hypertension: Secondary | ICD-10-CM | POA: Diagnosis not present

## 2018-01-16 DIAGNOSIS — F0390 Unspecified dementia without behavioral disturbance: Secondary | ICD-10-CM | POA: Insufficient documentation

## 2018-01-16 DIAGNOSIS — Z79899 Other long term (current) drug therapy: Secondary | ICD-10-CM | POA: Diagnosis not present

## 2018-01-16 DIAGNOSIS — Z87891 Personal history of nicotine dependence: Secondary | ICD-10-CM | POA: Diagnosis not present

## 2018-01-16 DIAGNOSIS — R339 Retention of urine, unspecified: Secondary | ICD-10-CM | POA: Insufficient documentation

## 2018-01-16 LAB — URINALYSIS, ROUTINE W REFLEX MICROSCOPIC
BILIRUBIN URINE: NEGATIVE
Glucose, UA: NEGATIVE mg/dL
Ketones, ur: NEGATIVE mg/dL
Nitrite: NEGATIVE
Protein, ur: NEGATIVE mg/dL
SPECIFIC GRAVITY, URINE: 1.01 (ref 1.005–1.030)
pH: 7 (ref 5.0–8.0)

## 2018-01-16 LAB — I-STAT CHEM 8, ED
BUN: 12 mg/dL (ref 6–20)
Calcium, Ion: 1.27 mmol/L (ref 1.15–1.40)
Chloride: 102 mmol/L (ref 101–111)
Creatinine, Ser: 0.8 mg/dL (ref 0.61–1.24)
Glucose, Bld: 131 mg/dL — ABNORMAL HIGH (ref 65–99)
HCT: 42 % (ref 39.0–52.0)
HEMOGLOBIN: 14.3 g/dL (ref 13.0–17.0)
POTASSIUM: 3.6 mmol/L (ref 3.5–5.1)
SODIUM: 142 mmol/L (ref 135–145)
TCO2: 28 mmol/L (ref 22–32)

## 2018-01-16 MED ORDER — OXYCODONE-ACETAMINOPHEN 5-325 MG PO TABS
1.0000 | ORAL_TABLET | ORAL | Status: DC | PRN
  Administered 2018-01-16: 1 via ORAL
  Filled 2018-01-16: qty 1

## 2018-01-16 NOTE — ED Triage Notes (Signed)
Paient here from home with complaints of urinary retention. States that he had a Foley placed 2 days ago, and catheter is not draining. Hx of prostate cancer.

## 2018-01-16 NOTE — ED Notes (Signed)
RN irrigated Foley Cath with 60 ml's NS. Return noted immediately following irrigation.

## 2018-01-16 NOTE — ED Provider Notes (Signed)
[facility] DEPT Provider Note   CSN: [id number] Arrival date & time: 01/16/18  1145     History   Chief Complaint Chief Complaint  Patient presents with  . Urinary Retention    HPI Andres Murray is a 84 y.o. male.  84 year old male with prior history of hyperlipidemia, hypertension, prostate cancer, and urinary retention presents for evaluation of his Foley catheter.  Patient was here on March 2 for urinary retention.  Foley catheter was placed by urology services while in the ED.  Patient returns today and reports that he is had minimal output from the catheter over the course of today.  Patient denies significant abdominal pain.  Patient denies fever.  Patient denies blood in the urine.    Patient reports that he has a followup appointment with Dr. Gloriann Loan (urology) later this week on the 7th.   At the time of my evaluation, the patient's Foley catheter bag has approximately 150 cc of clear yellow urine.   The history is provided by the patient.  Illness  This is a recurrent problem. The current episode started more than 2 days ago. The problem occurs every several days. The problem has not changed since onset.Pertinent negatives include no chest pain, no abdominal pain, no headaches and no shortness of breath. Nothing aggravates the symptoms. Nothing relieves the symptoms. He has tried nothing for the symptoms. The treatment provided mild relief.    Past Medical History:  Diagnosis Date  . Anxiety   . Hyperlipemia   . Hypertension   . Persistent dry cough     Patient Active Problem List   Diagnosis Date Noted  . Mild dementia 09/26/2017  . Paresthesia 03/24/2017  . Mild cognitive impairment 11/03/2016  . Anxiety 09/06/2016  . Abnormal brain MRI 09/06/2016  . Chronic cough 09/06/2016    Past Surgical History:  Procedure Laterality Date  . KNEE SURGERY Right   . TONSILLECTOMY         Home Medications    Prior to Admission  medications   Medication Sig Start Date End Date Taking? Authorizing Provider  amLODipine-valsartan (EXFORGE) 5-320 MG per tablet Take 1 tablet by mouth daily.    [provider]  atorvastatin (LIPITOR) 40 MG tablet Take 40 mg by mouth daily.    [provider]  donepezil (ARICEPT) 10 MG tablet Take 1 tablet (10 mg total) at bedtime by mouth. 09/26/17   Marcial Pacas, MD  esomeprazole (NEXIUM) 40 MG capsule Take 40 mg by mouth as needed (heartburn/reflux).     [provider]  meclizine (ANTIVERT) 12.5 MG tablet Take 12.5 mg by mouth as needed for dizziness.    [provider]  memantine (NAMENDA) 10 MG tablet Take 1 tablet (10 mg total) 2 (two) times daily by mouth. 09/26/17   Marcial Pacas, MD  oxybutynin (DITROPAN) 5 MG tablet Take 5 mg by mouth 3 (three) times daily.    [provider]  tamsulosin (FLOMAX) 0.4 MG CAPS capsule Take 0.4 mg by mouth daily after supper.    [provider]    Family History Family History  Problem Relation Age of Onset  . Diabetes Mother   . Appendicitis Father     Social History Social History   Tobacco Use  . Smoking status: Former Research scientist (life sciences)  . Smokeless tobacco: Never Used  Substance Use Topics  . Alcohol use: Yes    Comment: Occasional beer  . Drug use: No     Allergies  Patient has no known allergies.   Review of Systems Review of Systems  Respiratory: Negative for shortness of breath.   Cardiovascular: Negative for chest pain.  Gastrointestinal: Negative for abdominal pain.  Genitourinary: Positive for difficulty urinating.  Neurological: Negative for headaches.  All other systems reviewed and are negative.    Physical Exam Updated Vital Signs BP (!) 197/95 (BP Location: Left Arm)   Pulse 76   Temp 98.3 F (36.8 C) (Oral)   Resp 16   SpO2 97%   Physical Exam  Constitutional: He is oriented to person, place, and time. He appears well-developed and well-nourished. No distress.    HENT:  Head: Normocephalic and atraumatic.  Mouth/Throat: Oropharynx is clear and moist.  Eyes: Conjunctivae and EOM are normal. Pupils are equal, round, and reactive to light.  Neck: Normal range of motion. Neck supple.  Cardiovascular: Normal rate, regular rhythm and normal heart sounds.  Pulmonary/Chest: Effort normal and breath sounds normal. No respiratory distress.  Abdominal: Soft. He exhibits no distension. There is no tenderness.  Genitourinary:  Genitourinary Comments: Catheter in place  Clear yellow urine in foley bag   Musculoskeletal: Normal range of motion. He exhibits no edema or deformity.  Neurological: He is alert and oriented to person, place, and time.  Skin: Skin is warm and dry.  Psychiatric: He has a normal mood and affect.  Nursing note and vitals reviewed.    ED Treatments / Results  Labs (all labs ordered are listed, but only abnormal results are displayed) Labs Reviewed  URINALYSIS, ROUTINE W REFLEX MICROSCOPIC - Abnormal; Notable for the following components:      Result Value   Hgb urine dipstick MODERATE (*)    Leukocytes, UA TRACE (*)    Bacteria, UA RARE (*)    Squamous Epithelial / LPF 0-5 (*)    All other components within normal limits  I-STAT CHEM 8, ED - Abnormal; Notable for the following components:   Glucose, Bld 131 (*)    All other components within normal limits    EKG  EKG Interpretation None       Radiology No results found.  Procedures Procedures (including critical care time)  Medications Ordered in ED Medications  oxyCODONE-acetaminophen (PERCOCET/ROXICET) 5-325 MG per tablet 1 tablet (1 tablet Oral Given 01/16/18 1328)     Initial Impression / Assessment and Plan / ED Course  I have reviewed the triage vital signs and the nursing notes.  Pertinent labs & imaging results that were available during my care of the patient were reviewed by me and considered in my medical decision making (see chart for details).      MDM  Screen complete  Patient is reporting possible clogged foley catheter after recent Foley catheter placement.  Following ED evaluation and workup today patient feels significantly improved.  Clear urine is noted to be draining from his catheter.  Patient reports that flushing and manipulating the catheter upon his initial evaluation may have resolved the blockage.  Screening labs do not reveal significant renal isssues or other electrolyte abnormality.  Patient has established follow-up later this week with his urologist. He understands the need for close follow-up.  Strict return precautions given and understood - the presence of fever or increased pain - mandate a re-evaluation.  Final Clinical Impressions(s) / ED Diagnoses   Final diagnoses:  Urinary retention    ED Discharge Orders    None       Valarie Merino, MD 01/16/18 2024

## 2018-01-16 NOTE — ED Notes (Signed)
This tech cleaned foley tubing and inserted the tubing further in then blew the ballon back up. Pt feeling pain in lower adb area. Roughly 20cc output.

## 2018-02-20 ENCOUNTER — Telehealth: Payer: Self-pay | Admitting: *Deleted

## 2018-02-20 NOTE — Telephone Encounter (Signed)
Returned letter  

## 2018-03-30 ENCOUNTER — Encounter: Payer: Self-pay | Admitting: Neurology

## 2018-03-30 ENCOUNTER — Ambulatory Visit (INDEPENDENT_AMBULATORY_CARE_PROVIDER_SITE_OTHER): Payer: Medicare Other | Admitting: Neurology

## 2018-03-30 VITALS — BP 158/71 | HR 54 | Ht 65.0 in | Wt 134.5 lb

## 2018-03-30 DIAGNOSIS — G3184 Mild cognitive impairment, so stated: Secondary | ICD-10-CM

## 2018-03-30 MED ORDER — DONEPEZIL HCL 10 MG PO TABS
10.0000 mg | ORAL_TABLET | Freq: Every day | ORAL | 4 refills | Status: DC
Start: 2018-03-30 — End: 2021-07-23

## 2018-03-30 MED ORDER — MEMANTINE HCL 10 MG PO TABS: 10.0000 mg | ORAL_TABLET | Freq: Two times a day (BID) | ORAL | 4 refills | Status: DC

## 2018-03-30 NOTE — Progress Notes (Signed)
PATIENT: Andres Murray DOB: [birth year]  Chief Complaint  Patient presents with  . [name]    He was referred here to discuss his symptoms of daily anxiety and nervousness.  States he has also developed a persistant, dry cough over the last six months.  [name] PCP    [name], MD     HISTORICAL  Andres Murray is a 84 years old right-handed male, seen in refer by his primary care [place] for evaluation of anxiety, frequent dry cough, initial evaluation was on September 06 2016.   I reviewed and summarized the referral note, he had a history of hyperlipidemia, hypertension, has been on combination of amlodipine, valsartan treatment for more than 10 years, he is going through a lot of stress recently, he is going to move out of his house to assistant living around January 2018, he has to give up his dog, his wife suffered memory loss, he was recently diagnosed with prostate cancer in August 2017, will have treatment soon.  Around spring of 2017, he began to notice worsening anxiety, feeling nervousness, frequent dry cough, body movement, difficulty falling to sleep, over past few months, he has been taking Tylenol or Advil PM, which helped her symptoms some.  During interview, he was noticed to have frequent dry cough, shoulder shrugging body twisting, tic-like abnormal movement, he denied previous history of tic  He denies visual loss, no significant memory loss, he is a retired school principal at age 10, still enjoys reading,  We have personally reviewed MRI of the brain without contrast in 2014/03/11, extensive periventricular white matter disease consistent with small vessel disease, incidental finding of quadrigeminal plate lipoma  UPDATE Nov 03 2016: He is now taking nortriptyline 50mg  qhs, which has helped him sleep, but he still has difficulty sleepiness.  During the day, he still has frequent body jerking, clear his throat, he complains of depression anxiety, chronic  insomnia,  We have personally reviewed MRI scan on October 11 2016, moderate perisylvian atrophy, mild to moderate periventricular and subcortical chronic small vessel disease, mild progression compared to previous scan in 11-Mar-2014  UPDATE Mar 24 2017: He had prostate cancer, he had seeding procedure, also suffered kidney stone, had a recent procedure, he wore Foley cath today, he has moved to independent living at [facility], his wife passed away in Mar 11, 2017, he is tearful,  Complains of worsening anxiety, motor and vocal tics, difficulty sleeping, he is also confused about the medication he is taking, complains of dizziness, dry mouth,  He also complains few weeks history of intermittent right hand paresthesia mainly involving fourth fifth fingers, radiating to right arm, he denies neck pain,  UPDATE Sep 26 2017: We have reviewed MRI brain in Nov 2017, mild atrophy, small vessel disease.  He now lives at independent living, with worsening memory loss, drives to clinic today.  He takes melatonin for insominia.  UPDATE Mar 30 2018: He is overall doing very well, lives at independent living at [facility], drive to clinic today, continue has mild memory loss, was noted to be anxious during today's interview, has frequent motor tics, vocal tics, shrugging his right shoulder, clear his throat frequently, no longer on any anti-anxiety medications.  Laboratory evaluation seen 11-Mar-2018 showed normal CMP with glucose of 131, normal vitamin D 45, B12, TSH, negative RPR,   REVIEW OF SYSTEMS: Full 14 system review of systems performed and notable only for: frequent urinations  ALLERGIES: No Known Allergies  HOME  MEDICATIONS: Current Outpatient Medications  Medication Sig Dispense Refill  . amLODipine-valsartan (EXFORGE) 5-320 MG per tablet Take 1 tablet by mouth daily.    [name] atorvastatin (LIPITOR) 40 MG tablet Take 40 mg by mouth every evening.     . donepezil (ARICEPT) 10 MG tablet Take 1  tablet (10 mg total) at bedtime by mouth. 30 tablet 11  . esomeprazole (NEXIUM) 40 MG capsule Take 40 mg by mouth as needed (heartburn/reflux).     . meclizine (ANTIVERT) 12.5 MG tablet Take 12.5 mg by mouth 2 (two) times daily.     . memantine (NAMENDA) 10 MG tablet Take 1 tablet (10 mg total) 2 (two) times daily by mouth. 60 tablet 11  . triamcinolone cream (KENALOG) 0.1 % APPLY TWICE A DAY TO BODY. NEVER USE ON FACE  0   No current facility-administered medications for this visit.     PAST MEDICAL HISTORY: Past Medical History:  Diagnosis Date  . Anxiety   . Hyperlipemia   . Hypertension   . Persistent dry cough     PAST SURGICAL HISTORY: Past Surgical History:  Procedure Laterality Date  . KNEE SURGERY Right   . TONSILLECTOMY      FAMILY HISTORY: Family History  Problem Relation Age of Onset  . Diabetes Mother   . Appendicitis Father     SOCIAL HISTORY:  Social History   Socioeconomic History  . Marital status: Married    Spouse name: Not on file  . Number of children: 2  . Years of education: Doctorate  . Highest education level: Not on file  Occupational History  . Occupation: Retired  Scientific laboratory technician  . Financial resource strain: Not on file  . Food insecurity:    Worry: Not on file    Inability: Not on file  . Transportation needs:    Medical: Not on file    Non-medical: Not on file  Tobacco Use  . Smoking status: Former Research scientist (life sciences)  . Smokeless tobacco: Never Used  Substance and Sexual Activity  . Alcohol use: Yes    Comment: Occasional beer  . Drug use: No  . Sexual activity: Not on file  Lifestyle  . Physical activity:    Days per week: Not on file    Minutes per session: Not on file  . Stress: Not on file  Relationships  . Social connections:    Talks on phone: Not on file    Gets together: Not on file    Attends religious service: Not on file    Active member of club or organization: Not on file    Attends meetings of clubs or organizations:  Not on file    Relationship status: Not on file  . Intimate partner violence:    Fear of current or ex partner: Not on file    Emotionally abused: Not on file    Physically abused: Not on file    Forced sexual activity: Not on file  Other Topics Concern  . Not on file  Social History Narrative   Lives at home with wife, Andres Murray.   Right-handed.   4-5 cups caffeine daily.     PHYSICAL EXAM   Vitals:   03/30/18 1052  BP: (!) 158/71  Pulse: (!) 54  Weight: 134 lb 8 oz (61 kg)  Height: 5\' 5"  (1.651 m)    Not recorded      Body mass index is 22.38 kg/m.  PHYSICAL EXAMNIATION:  Gen: NAD, conversant, well nourised, obese, well groomed  Cardiovascular: Regular rate rhythm, no peripheral edema, warm, nontender. Eyes: Conjunctivae clear without exudates or hemorrhage Neck: Supple, no carotid bruits. Pulmonary: Clear to auscultation bilaterally   NEUROLOGICAL EXAM:  MMSE - Mini Mental State Exam 03/30/2018 09/26/2017  Orientation to time 5 5  Orientation to Place 5 5  Registration 3 3  Attention/ Calculation 5 2  Recall 2 0  Language- name 2 objects 2 2  Language- repeat 1 1  Language- follow 3 step command 2 3  Language- read & follow direction 1 1  Write a sentence 1 1  Copy design 0 1  Total score 27 24    MENTAL STATUS:Anxious looking elderly male, with frequent dry cough, body movement, shoulder shrugging, suggestive of motor tics,    CRANIAL NERVES: CN II: Visual fields are full to confrontation. Fundoscopic exam is normal with sharp discs and no vascular changes. Pupils are round equal and briskly reactive to light. CN III, IV, VI: extraocular movement are normal. No ptosis. CN V: Facial sensation is intact to pinprick in all 3 divisions bilaterally. Corneal responses are intact.  CN VII: Face is symmetric with normal eye closure and smile. CN VIII: Hearing is normal to rubbing fingers CN IX, X: Palate elevates symmetrically. Phonation is  normal. CN XI: Head turning and shoulder shrug are intact CN XII: Tongue is midline with normal movements and no atrophy.  MOTOR: There is no pronator drift of out-stretched arms. Muscle bulk and tone are normal. Muscle strength is normal.  REFLEXES: Reflexes are 2+ and symmetric at the biceps, triceps, knees, and ankles. Plantar responses are flexor.  SENSORY: Intact to light touch, pinprick, positional sensation and vibratory sensation are intact in fingers and toes.  COORDINATION: Rapid alternating movements and fine finger movements are intact. There is no dysmetria on finger-to-nose and heel-knee-shin.    GAIT/STANCE: Need to push up from seated position, mildly cautious, wearing Foley catheters with bloody draine today Romberg is absent.   DIAGNOSTIC DATA (LABS, IMAGING, TESTING) - I reviewed patient records, labs, notes, testing and imaging myself where available.   ASSESSMENT AND PLAN  Andres Murray is a 84 y.o. male  Mild cognitive impairment  MMSE 27/30  MRI of the brain in November 2017, mild generalized atrophy, supratentorium small vessel disease  Keep Aricept 10mg  daily, Namenda 10mg  bid  Laboratory evaluation for treatable etiology    Worsening anxiety, motor and vocal tics, chronic insomnia   He may take melatonin for insomnia   Continue follow-up with his primary care physician  Marcial Pacas, M.D. Ph.D.  [facility] [address], [place], [place] [address] Ph: [phone] Fax: [phone]  CC: Referring Provider

## 2018-08-08 ENCOUNTER — Ambulatory Visit (INDEPENDENT_AMBULATORY_CARE_PROVIDER_SITE_OTHER): Payer: Medicare Other | Admitting: Allergy and Immunology

## 2018-08-08 ENCOUNTER — Encounter: Payer: Self-pay | Admitting: Allergy and Immunology

## 2018-08-08 VITALS — BP 124/64 | HR 72 | Temp 98.2°F | Resp 16 | Ht 63.0 in | Wt 131.0 lb

## 2018-08-08 DIAGNOSIS — R432 Parageusia: Secondary | ICD-10-CM

## 2018-08-08 DIAGNOSIS — L299 Pruritus, unspecified: Secondary | ICD-10-CM | POA: Diagnosis not present

## 2018-08-08 MED ORDER — RANITIDINE HCL 300 MG PO CAPS: 300.0000 mg | ORAL_CAPSULE | Freq: Every evening | ORAL | 5 refills | Status: DC

## 2018-08-08 NOTE — Patient Instructions (Addendum)
  1.  Discontinue Flomax  2.  Change Nexium to ranitidine 300 mg 1 time per day and continue Tums  3.  Continue topical triamcinolone  4.  Review results of skin biopsy  5.  Blood - CBC w/diff, CMP, TSH, T4  6.  Return to clinic in 3 weeks or earlier if problem  7.  Obtain full flu vaccine

## 2018-08-08 NOTE — Progress Notes (Signed)
Dear Andres Murray,  Thank you for referring Andres Murray to the [facility] on 08/08/2018.   Below is a summation of this patient's evaluation and recommendations.  Thank you for your referral. I will keep you informed about this patient's response to treatment.   If you have any questions please do not hesitate to contact me.   Sincerely,  Andres Prows, MD Allergy / Immunology [facility]   ______________________________________________________________________    NEW PATIENT NOTE  Referring Provider: [name], MD Primary Provider: [name], MD Date of office visit: 08/08/2018    Subjective:   Chief Complaint:  Andres Murray (DOB: Feb 05, 1934) is a 84 y.o. male who presents to the clinic on 08/08/2018 with a chief complaint of Taste Issue .     HPI: Andres Murray presents to this clinic in evaluation of several complaints.  First, he appears to have an issue with food tasting bad.  This is a metallic taste but sometimes it does not really have a metallic quality.  He has to force himself to eat.  This is been going on for about 3 months or so.  Apparently he can smell without any difficulty.  He has had a amalgam replacement recently which did not help this issue.  Second, he has itchiness.  This has also been going on for 3 years or so.  This involves predominantly his extremities and he has been evaluated and treated by multiple dermatologists including the dermatologist at [facility] and is presently using topical triamcinolone to treat this issue which he does think helps somewhat.  He apparently had a biopsy of his skin which identified no significant abnormality.  Third, he has reflux.  He has burping and some regurgitation.  He is on a proton pump inhibitor but must supplement this with Mylanta and Tums multiple times a day.  He does drink 1 coffee per day.  He did have an  upper endoscopy about 5 years ago.  Fourth, he has a tightness in his throat and throat clearing and never really completely can clear out his throat.  He does not really have any significant raspy voice.  He is on several medications.  It should be noted that he started Flomax approximately 3 months ago for the treatment of prostate cancer treated with radioactive seeding.  Ever since he had his radioactive seeds placed he has been having problems with constant leakage and he must use a diaper twice a day.  He has been using Nexium for about 6 months on and off.  He has been on donepezil about 1 year and has been using metoprolol and atorvastatin for many years.  Past Medical History:  Diagnosis Date  . Anxiety   . Hyperlipemia   . Hypertension   . Persistent dry cough     Past Surgical History:  Procedure Laterality Date  . KNEE SURGERY Right   . TONSILLECTOMY      Allergies as of 08/08/2018   No Known Allergies     Medication List      atorvastatin 40 MG tablet Commonly known as:  LIPITOR Take 40 mg by mouth every evening.   donepezil 10 MG tablet Commonly known as:  ARICEPT Take 1 tablet (10 mg total) by mouth at bedtime.   meclizine 12.5 MG tablet Commonly known as:  ANTIVERT Take 12.5 mg by mouth 2 (two) times daily.   memantine 10  MG tablet Commonly known as:  NAMENDA Take 1 tablet (10 mg total) by mouth 2 (two) times daily.   ranitidine 300 MG capsule Commonly known as:  ZANTAC Take 1 capsule (300 mg total) by mouth every evening.   triamcinolone cream 0.1 % Commonly known as:  KENALOG APPLY TWICE A DAY TO BODY. NEVER USE ON FACE       Review of systems negative except as noted in HPI / PMHx or noted below:  Review of Systems  Constitutional: Negative.   HENT: Negative.   Eyes: Negative.   Respiratory: Negative.   Cardiovascular: Negative.   Gastrointestinal: Negative.   Genitourinary: Negative.   Musculoskeletal: Negative.   Skin: Negative.     Neurological: Negative.   Endo/Heme/Allergies: Negative.   Psychiatric/Behavioral: Negative.     Family History  Problem Relation Age of Onset  . Diabetes Mother   . Appendicitis Father     Social History   Socioeconomic History  . Marital status: Married    Spouse name: Not on file  . Number of children: 2  . Years of education: Doctorate  . Highest education level: Not on file  Occupational History  . Occupation: Retired  Scientific laboratory technician  . Financial resource strain: Not on file  . Food insecurity:    Worry: Not on file    Inability: Not on file  . Transportation needs:    Medical: Not on file    Non-medical: Not on file  Tobacco Use  . Smoking status: Former Research scientist (life sciences)  . Smokeless tobacco: Never Used  Substance and Sexual Activity  . Alcohol use: Yes    Comment: Occasional beer  . Drug use: No  . Sexual activity: Not on file  Lifestyle  . Physical activity:    Days per week: Not on file    Minutes per session: Not on file  . Stress: Not on file  Relationships  . Social connections:    Talks on phone: Not on file    Gets together: Not on file    Attends religious service: Not on file    Active member of club or organization: Not on file    Attends meetings of clubs or organizations: Not on file    Relationship status: Not on file  . Intimate partner violence:    Fear of current or ex partner: Not on file    Emotionally abused: Not on file    Physically abused: Not on file    Forced sexual activity: Not on file  Other Topics Concern  . Not on file  Social History Narrative   Lives at home with wife, Andres Murray.   Right-handed.   4-5 cups caffeine daily.    Environmental and Social history  Lives in a residential [place] [facility], no dog located inside the household, no carpet in the bedroom, plastic on the bed, plastic on the pillow, no smokers located inside the household.  Objective:   Vitals:   08/08/18 1404  BP: 124/64  Pulse: 72  Resp: 16  Temp:  98.2 F (36.8 C)  SpO2: 95%   Height: 5\' 3"  (160 cm) Weight: 131 lb (59.4 kg)  Physical Exam  Constitutional:  Constant throat clearing, grunting  HENT:  Head: Normocephalic. Head is without right periorbital erythema and without left periorbital erythema.  Right Ear: Tympanic membrane, external ear and ear canal normal.  Left Ear: Tympanic membrane, external ear and ear canal normal.  Nose: Nose normal. No mucosal edema or rhinorrhea.  Mouth/Throat: Oropharynx  is clear and moist and mucous membranes are normal. No oropharyngeal exudate.  Eyes: Pupils are equal, round, and reactive to light. Conjunctivae and lids are normal.  Neck: Trachea normal. No tracheal deviation present. No thyromegaly present.  Cardiovascular: Normal rate, regular rhythm, S1 normal, S2 normal and normal heart sounds.  No murmur heard. Pulmonary/Chest: Effort normal. No stridor. No respiratory distress. He has no wheezes. He has no rales. He exhibits no tenderness.  Abdominal: Soft. He exhibits no distension and no mass. There is no hepatosplenomegaly. There is no tenderness. There is no rebound and no guarding.  Musculoskeletal: He exhibits no edema or tenderness.  Lymphadenopathy:       Head (right side): No tonsillar adenopathy present.       Head (left side): No tonsillar adenopathy present.    He has no cervical adenopathy.    He has no axillary adenopathy.  Neurological: He is alert.  Skin: Rash (Multiple excoriated nodules across upper and lower extremity) noted. He is not diaphoretic. No erythema. No pallor. Nails show no clubbing.    Diagnostics: Allergy skin tests were not performed.   Results of blood test performed 26 September 2017 identified WBC 5.5, absolute eosinophil 300, absolute lymphocyte 1600, hemoglobin 14.7, creatinine 0.97 mg/DL, normal AST, normal ALT.  Results of a head CT scan performed 08 January 2017 identified the following:  Brain: Low attenuation throughout the subcortical  and periventricular white matter is identified compatible with chronic small vessel ischemic disease. 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 No evidence for acute brain infarct. No intracranial hemorrhage or mass noted.  Vascular: No hyperdense vessel or unexpected calcification.  Skull: The calvarium appears intact.  Sinuses/Orbits: The paranasal sinuses and mastoid air cells are clear. No acute finding.  Assessment and Plan:    1. Dysgeusia   2. Pruritic disorder     1.  Discontinue Flomax  2.  Change Nexium to ranitidine 300 mg 1 time per day and continue Tums  3.  Continue topical triamcinolone  4.  Review results of skin biopsy  5.  Blood - CBC w/diff, CMP, TSH, T4  6.  Return to clinic in 3 weeks or earlier if problem  7.  Obtain full flu vaccine  Albi has dysgeusia and the pruritic disorder of unknown etiology.  His dysgeusia could be secondary to a drug effect and we will discontinue his Flomax and change his proton pump inhibitor to a different form of treatment directed against reflux as noted above.  I have ordered some screening blood tests looking for a worrisome systemic disease contributing to this issue and I will review the results of his skin biopsy.  Further evaluation and treatment will be based upon his response to this approach and the results of his diagnostic testing.  Andres Prows, MD Allergy / Immunology [facility]

## 2018-08-09 ENCOUNTER — Encounter: Payer: Self-pay | Admitting: Allergy and Immunology

## 2018-08-09 LAB — CBC WITH DIFFERENTIAL/PLATELET
BASOS: 1 %
Basophils Absolute: 0 10*3/uL (ref 0.0–0.2)
EOS (ABSOLUTE): 0.2 10*3/uL (ref 0.0–0.4)
Eos: 4 %
HEMATOCRIT: 38.1 % (ref 37.5–51.0)
Hemoglobin: 12.6 g/dL — ABNORMAL LOW (ref 13.0–17.7)
Immature Grans (Abs): 0 10*3/uL (ref 0.0–0.1)
Immature Granulocytes: 0 %
LYMPHS: 19 %
Lymphocytes Absolute: 1.1 10*3/uL (ref 0.7–3.1)
MCH: 30.7 pg (ref 26.6–33.0)
MCHC: 33.1 g/dL (ref 31.5–35.7)
MCV: 93 fL (ref 79–97)
Monocytes Absolute: 0.6 10*3/uL (ref 0.1–0.9)
Monocytes: 11 %
NEUTROS ABS: 3.7 10*3/uL (ref 1.4–7.0)
Neutrophils: 65 %
PLATELETS: 144 10*3/uL — AB (ref 150–450)
RBC: 4.1 x10E6/uL — ABNORMAL LOW (ref 4.14–5.80)
RDW: 15.6 % — AB (ref 12.3–15.4)
WBC: 5.6 10*3/uL (ref 3.4–10.8)

## 2018-08-09 LAB — T4, FREE: Free T4: 1.26 ng/dL (ref 0.82–1.77)

## 2018-08-15 LAB — COMPREHENSIVE METABOLIC PANEL
ALBUMIN: 3.9 g/dL (ref 3.5–4.7)
ALK PHOS: 62 IU/L (ref 39–117)
ALT: 12 IU/L (ref 0–44)
AST: 16 IU/L (ref 0–40)
Albumin/Globulin Ratio: 1.7 (ref 1.2–2.2)
BUN / CREAT RATIO: 14 (ref 10–24)
BUN: 19 mg/dL (ref 8–27)
Bilirubin Total: <0.2 mg/dL (ref 0.0–1.2)
CO2: 24 mmol/L (ref 20–29)
Calcium: 10.3 mg/dL — ABNORMAL HIGH (ref 8.6–10.2)
Chloride: 102 mmol/L (ref 96–106)
Creatinine, Ser: 1.32 mg/dL — ABNORMAL HIGH (ref 0.76–1.27)
GFR calc Af Amer: 58 mL/min/{1.73_m2} — ABNORMAL LOW (ref >59)
GFR calc non Af Amer: 50 mL/min/{1.73_m2} — ABNORMAL LOW (ref >59)
GLUCOSE: 129 mg/dL — AB (ref 65–99)
Globulin, Total: 2.3 g/dL (ref 1.5–4.5)
Potassium: 3.9 mmol/L (ref 3.5–5.2)
SODIUM: 144 mmol/L (ref 134–144)
Total Protein: 6.2 g/dL (ref 6.0–8.5)

## 2018-08-15 LAB — SPECIMEN STATUS REPORT

## 2018-08-15 LAB — TSH: TSH: 0.906 u[IU]/mL (ref 0.450–4.500)

## 2018-08-28 ENCOUNTER — Ambulatory Visit: Payer: Medicare Other | Admitting: Allergy

## 2018-08-28 ENCOUNTER — Emergency Department (HOSPITAL_COMMUNITY)
Admission: EM | Admit: 2018-08-28 | Discharge: 2018-08-28 | Disposition: A | Discharge status: Home / Self Care | Payer: Medicare Other | Attending: Emergency Medicine | Admitting: Emergency Medicine

## 2018-08-28 ENCOUNTER — Encounter (HOSPITAL_COMMUNITY): Payer: Self-pay

## 2018-08-28 ENCOUNTER — Emergency Department (HOSPITAL_COMMUNITY): Payer: Medicare Other

## 2018-08-28 ENCOUNTER — Other Ambulatory Visit: Payer: Self-pay

## 2018-08-28 DIAGNOSIS — I10 Essential (primary) hypertension: Secondary | ICD-10-CM | POA: Insufficient documentation

## 2018-08-28 DIAGNOSIS — N35819 Other urethral stricture, male, unspecified site: Secondary | ICD-10-CM | POA: Diagnosis not present

## 2018-08-28 DIAGNOSIS — Z79899 Other long term (current) drug therapy: Secondary | ICD-10-CM | POA: Diagnosis not present

## 2018-08-28 DIAGNOSIS — Z87891 Personal history of nicotine dependence: Secondary | ICD-10-CM | POA: Diagnosis not present

## 2018-08-28 DIAGNOSIS — N390 Urinary tract infection, site not specified: Secondary | ICD-10-CM | POA: Diagnosis not present

## 2018-08-28 DIAGNOSIS — F0390 Unspecified dementia without behavioral disturbance: Secondary | ICD-10-CM | POA: Diagnosis not present

## 2018-08-28 DIAGNOSIS — R300 Dysuria: Secondary | ICD-10-CM | POA: Diagnosis present

## 2018-08-28 LAB — URINALYSIS, ROUTINE W REFLEX MICROSCOPIC
Bilirubin Urine: NEGATIVE
Glucose, UA: NEGATIVE mg/dL
Ketones, ur: NEGATIVE mg/dL
Nitrite: POSITIVE — AB
Protein, ur: 100 mg/dL — AB
RBC / HPF: >50 RBC/hpf — ABNORMAL HIGH (ref 0–5)
SPECIFIC GRAVITY, URINE: 1.012 (ref 1.005–1.030)
WBC, UA: >50 WBC/hpf — ABNORMAL HIGH (ref 0–5)
pH: 8 (ref 5.0–8.0)

## 2018-08-28 LAB — BASIC METABOLIC PANEL
Anion gap: 10 (ref 5–15)
BUN: 23 mg/dL (ref 8–23)
CHLORIDE: 104 mmol/L (ref 98–111)
CO2: 28 mmol/L (ref 22–32)
CREATININE: 1.2 mg/dL (ref 0.61–1.24)
Calcium: 9.5 mg/dL (ref 8.9–10.3)
GFR calc non Af Amer: 54 mL/min — ABNORMAL LOW (ref >60)
Glucose, Bld: 135 mg/dL — ABNORMAL HIGH (ref 70–99)
POTASSIUM: 3.7 mmol/L (ref 3.5–5.1)
SODIUM: 142 mmol/L (ref 135–145)

## 2018-08-28 LAB — CBC WITH DIFFERENTIAL/PLATELET
ABS IMMATURE GRANULOCYTES: 0.04 10*3/uL (ref 0.00–0.07)
BASOS ABS: 0 10*3/uL (ref 0.0–0.1)
Basophils Relative: 0 %
Eosinophils Absolute: 0.2 10*3/uL (ref 0.0–0.5)
Eosinophils Relative: 2 %
HCT: 35.1 % — ABNORMAL LOW (ref 39.0–52.0)
HEMOGLOBIN: 11.4 g/dL — AB (ref 13.0–17.0)
IMMATURE GRANULOCYTES: 0 %
LYMPHS PCT: 10 %
Lymphs Abs: 1 10*3/uL (ref 0.7–4.0)
MCH: 30.6 pg (ref 26.0–34.0)
MCHC: 32.5 g/dL (ref 30.0–36.0)
MCV: 94.1 fL (ref 80.0–100.0)
Monocytes Absolute: 0.7 10*3/uL (ref 0.1–1.0)
Monocytes Relative: 7 %
NEUTROS ABS: 8.3 10*3/uL — AB (ref 1.7–7.7)
NEUTROS PCT: 81 %
NRBC: 0 % (ref 0.0–0.2)
Platelets: 165 10*3/uL (ref 150–400)
RBC: 3.73 MIL/uL — AB (ref 4.22–5.81)
RDW: 14.6 % (ref 11.5–15.5)
WBC: 10.3 10*3/uL (ref 4.0–10.5)

## 2018-08-28 MED ORDER — CEPHALEXIN 500 MG PO CAPS: 500.0000 mg | ORAL_CAPSULE | Freq: Three times a day (TID) | ORAL | 0 refills | Status: AC

## 2018-08-28 MED ORDER — LIDOCAINE HCL URETHRAL/MUCOSAL 2 % EX GEL: 1.0000 "application " | Freq: Once | CUTANEOUS | Status: DC

## 2018-08-28 MED ORDER — OXYCODONE HCL 5 MG PO TABS: ORAL_TABLET | ORAL | 0 refills | Status: DC

## 2018-08-28 MED ORDER — MORPHINE SULFATE (PF) 4 MG/ML IV SOLN
4.0000 mg | Freq: Once | INTRAVENOUS | Status: AC
  Administered 2018-08-28: 4 mg via INTRAVENOUS
  Filled 2018-08-28: qty 1

## 2018-08-28 MED ORDER — LIDOCAINE HCL URETHRAL/MUCOSAL 2 % EX GEL
1.0000 "application " | Freq: Once | CUTANEOUS | Status: AC
  Administered 2018-08-28: 1 via URETHRAL
  Filled 2018-08-28: qty 5

## 2018-08-28 MED ORDER — SODIUM CHLORIDE 0.9 % IV SOLN
2.0000 g | Freq: Once | INTRAVENOUS | Status: AC
  Administered 2018-08-28: 2 g via INTRAVENOUS
  Filled 2018-08-28: qty 20

## 2018-08-28 NOTE — ED Provider Notes (Signed)
[facility] DEPT Provider Note   CSN: [id number] Arrival date & time: 08/28/18  0106     History   Chief Complaint Chief Complaint  Patient presents with  . Dysuria    HPI Andres Murray is a 84 y.o. male.  HPI 84 yo M with h/o BPH s/p TURP here with dysuria. Pt was seen in March for similar sx. He reports 24 hours of progressively worsening burning pain with urination. He has been urinating more frequenlty as well but feels like he is not emptying out. He's had associated fullness sensation in his lower abdomen. Feels similar ot his last episode which required foley placement. Denies any fever, flank pain. Of note, pt has lost significant amounts of weight lately. Has not had any imaging of his abdomen/prostate. Pain worse with palpation. No alleviating factors.  Past Medical History:  Diagnosis Date  . Anxiety   . Hyperlipemia   . Hypertension   . Persistent dry cough     Patient Active Problem List   Diagnosis Date Noted  . Mild dementia ([place]) 09/26/2017  . Paresthesia 03/24/2017  . Mild cognitive impairment 11/03/2016  . Anxiety 09/06/2016  . Abnormal brain MRI 09/06/2016  . Chronic cough 09/06/2016    Past Surgical History:  Procedure Laterality Date  . KNEE SURGERY Right   . TONSILLECTOMY          Home Medications    Prior to Admission medications   Medication Sig Start Date End Date Taking? Authorizing Provider  atorvastatin (LIPITOR) 40 MG tablet Take 40 mg by mouth every evening.    Yes [provider]  donepezil (ARICEPT) 10 MG tablet Take 1 tablet (10 mg total) by mouth at bedtime. 03/30/18  Yes Marcial Pacas, MD  meclizine (ANTIVERT) 12.5 MG tablet Take 12.5 mg by mouth 2 (two) times daily as needed for dizziness.    Yes [provider]  memantine (NAMENDA) 10 MG tablet Take 1 tablet (10 mg total) by mouth 2 (two) times daily. 03/30/18  Yes Marcial Pacas, MD  mirtazapine (REMERON) 15 MG tablet Take 15 mg by mouth  at bedtime. 08/22/18  Yes [provider]  ranitidine (ZANTAC) 300 MG capsule Take 1 capsule (300 mg total) by mouth every evening. 08/08/18  Yes Kozlow, Donnamarie Poag, MD  triamcinolone cream (KENALOG) 0.1 % Apply 1 application topically 2 (two) times daily.   Yes [provider]  Trospium Chloride 60 MG CP24 Take 60 mg by mouth daily. 08/21/18  Yes [provider]  cephALEXin (KEFLEX) 500 MG capsule Take 1 capsule (500 mg total) by mouth 3 (three) times daily for 10 days. 08/28/18 09/07/18  Duffy Bruce, MD  oxyCODONE (ROXICODONE) 5 MG immediate release tablet For moderate pain, take one half tablet every 6 hours; for severe pain, take one full tablet every 6 hours 08/28/18   Duffy Bruce, MD    Family History Family History  Problem Relation Age of Onset  . Diabetes Mother   . Appendicitis Father     Social History Social History   Tobacco Use  . Smoking status: Former Research scientist (life sciences)  . Smokeless tobacco: Never Used  Substance Use Topics  . Alcohol use: Yes    Comment: Occasional beer  . Drug use: No     Allergies   Patient has no known allergies.   Review of Systems Review of Systems  Constitutional: Positive for fatigue and unexpected weight change. Negative for chills and fever.  HENT: Negative for congestion and  rhinorrhea.   Eyes: Negative for visual disturbance.  Respiratory: Negative for cough, shortness of breath and wheezing.   Cardiovascular: Negative for chest pain and leg swelling.  Gastrointestinal: Positive for abdominal pain. Negative for diarrhea, nausea and vomiting.  Genitourinary: Positive for decreased urine volume, difficulty urinating, dysuria and urgency. Negative for flank pain.  Musculoskeletal: Negative for neck pain and neck stiffness.  Skin: Negative for rash and wound.  Allergic/Immunologic: Negative for immunocompromised state.  Neurological: Negative for syncope, weakness and headaches.  All other systems reviewed and are  negative.    Physical Exam Updated Vital Signs BP (!) 143/77 (BP Location: Right Arm)   Pulse 64   Temp 98.3 F (36.8 C) (Oral)   Resp 18   Ht 5\' 5"  (1.651 m)   Wt 57.6 kg   SpO2 99%   BMI 21.13 kg/m   Physical Exam  Constitutional: He is oriented to person, place, and time. He appears well-developed and well-nourished. No distress.  Elderly, frail appearing  HENT:  Head: Normocephalic and atraumatic.  Eyes: Conjunctivae are normal.  Neck: Neck supple.  Cardiovascular: Normal rate, regular rhythm and normal heart sounds. Exam reveals no friction rub.  No murmur heard. Pulmonary/Chest: Effort normal and breath sounds normal. No respiratory distress. He has no wheezes. He has no rales.  Abdominal: Soft. Bowel sounds are normal. He exhibits no distension. There is tenderness (mild, suprapubic). There is no rebound and no guarding.  Musculoskeletal: He exhibits no edema.  Neurological: He is alert and oriented to person, place, and time. He exhibits normal muscle tone.  Skin: Skin is warm. Capillary refill takes less than 2 seconds.  Psychiatric: He has a normal mood and affect.  Nursing note and vitals reviewed.    ED Treatments / Results  Labs (all labs ordered are listed, but only abnormal results are displayed) Labs Reviewed  URINALYSIS, ROUTINE W REFLEX MICROSCOPIC - Abnormal; Notable for the following components:      Result Value   APPearance TURBID (*)    Hgb urine dipstick MODERATE (*)    Protein, ur 100 (*)    Nitrite POSITIVE (*)    Leukocytes, UA LARGE (*)    RBC / HPF >50 (*)    WBC, UA >50 (*)    Bacteria, UA MANY (*)    Non Squamous Epithelial 0-5 (*)    All other components within normal limits  CBC WITH DIFFERENTIAL/PLATELET - Abnormal; Notable for the following components:   RBC 3.73 (*)    Hemoglobin 11.4 (*)    HCT 35.1 (*)    Neutro Abs 8.3 (*)    All other components within normal limits  BASIC METABOLIC PANEL - Abnormal; Notable for the  following components:   Glucose, Bld 135 (*)    GFR calc non Af Amer 54 (*)    All other components within normal limits  URINE CULTURE    EKG None  Radiology Ct Renal Stone Study  Result Date: 08/28/2018 CLINICAL DATA:  Flank pain difficulty urinating. History of prostate cancer. EXAM: CT ABDOMEN AND PELVIS WITHOUT CONTRAST TECHNIQUE: Multidetector CT imaging of the abdomen and pelvis was performed following the standard protocol without IV contrast. COMPARISON:  CT abdomen pelvis 01/28/2017 FINDINGS: LOWER CHEST: There is no basilar pleural or apical pericardial effusion. HEPATOBILIARY: The hepatic contours and density are normal. There is no intra- or extrahepatic biliary dilatation. Status post cholecystectomy. PANCREAS: Unremarkable. SPLEEN: Numerous calcified splenic granulomata. ADRENALS/URINARY TRACT: --Adrenal glands: Normal. --Right kidney/ureter: There is  a stone within the proximal right ureter measuring 6 x 3 mm with mild hydroureteronephrosis. Multiple nonobstructing renal calculi of the right kidney measure up to 6 mm. --Left kidney/ureter: Multiple nonobstructing renal calculi measuring up to 5 mm. No hydronephrosis. --Urinary bladder: There is thickening of the urinary bladder wall. STOMACH/BOWEL: --Stomach/Duodenum: There is no hiatal hernia or other gastric abnormality. The duodenal course and caliber are normal. --Small bowel: No dilatation or inflammation. --Colon: Diffuse colonic diverticulosis without acute inflammation. --Appendix: Normal. VASCULAR/LYMPHATIC: Atherosclerotic calcification is present within the non-aneurysmal abdominal aorta, without hemodynamically significant stenosis. No abdominal or pelvic lymphadenopathy. REPRODUCTIVE: Brachytherapy seeds in the prostate. MUSCULOSKELETAL. Multilevel degenerative disc disease and facet arthrosis. No bony spinal canal stenosis. OTHER: None. IMPRESSION: 1. Right obstructive uropathy with 6 x 3 mm stone in the proximal right  ureter causing mild hydroureteronephrosis. 2. Bilateral nonobstructive nephrolithiasis in addition to the above-described ureteral stone. 3. Diffuse colonic diverticulosis without acute inflammation. 4.  Aortic Atherosclerosis (ICD10-I70.0). Electronically Signed   By: Ulyses Jarred M.D.   On: 08/28/2018 03:35    Procedures Procedures (including critical care time)  Medications Ordered in ED Medications  lidocaine (XYLOCAINE) 2 % jelly 1 application (1 application Urethral Given 08/28/18 0249)  cefTRIAXone (ROCEPHIN) 2 g in sodium chloride 0.9 % 100 mL IVPB (0 g Intravenous Stopped 08/28/18 0613)  morphine 4 MG/ML injection 4 mg (4 mg Intravenous Given 08/28/18 0630)     Initial Impression / Assessment and Plan / ED Course  I have reviewed the triage vital signs and the nursing notes.  Pertinent labs & imaging results that were available during my care of the patient were reviewed by me and considered in my medical decision making (see chart for details).     84 yo M here with dysuria, lower abd pain, urinary retention. On exam, pt appears to have possible urethral stricture obstructing at the glans. Unable to pass foley myself. Pt required placement with Dr. Diona Fanti in March. Will c/s Urology. Otherwise, renal fxn is at baseline with Cr. 1.20. WBC 10.3. No fever, no flank pain or s/s pyelo.  Dr. Alyson Ingles able to pass foley after dilatation. Will d/c on keflex, analgesics with outpt Urology follow-up.  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

## 2018-08-28 NOTE — ED Notes (Signed)
Pt. Made aware for the need of urine specimen. 

## 2018-08-28 NOTE — ED Triage Notes (Signed)
Pt reports that he hasn't been urinating normally since yesterday. States that he can only urinate very small amounts and when he does it is painful. Hx prostate problems per patient.

## 2018-08-28 NOTE — ED Notes (Signed)
Bladder Scan volume: 185ml

## 2018-08-28 NOTE — Consult Note (Signed)
Urology Consult  Referring physician: Dr. Ellender Hose Reason for referral: urinary retention, difficult foley placement  Chief Complaint: suprapubic pain  History of Present Illness: Andres Murray is a 84yo with a hx of prostate cancer, BPH, urethral stricture disease who presented to the Er this morning with an inability to urinate for the past 16 hours. He has severe urgency, frequency, dysuria and dribbling. He has a hx of urethral stricture disease and dystrophic prostate calcification and previously had a urethral dilation in 01/2018 by Dr. Vernie Shanks. He had be urinating at baseline until 1 week ago when his urine stream became weaker. No hematuria.   Past Medical History:  Diagnosis Date  . Anxiety   . Hyperlipemia   . Hypertension   . Persistent dry cough    Past Surgical History:  Procedure Laterality Date  . KNEE SURGERY Right   . TONSILLECTOMY      Medications: I have reviewed the patient's current medications. Allergies: No Known Allergies  Family History  Problem Relation Age of Onset  . Diabetes Mother   . Appendicitis Father    Social History:  reports that he has quit smoking. He has never used smokeless tobacco. He reports that he drinks alcohol. He reports that he does not use drugs.  Review of Systems  Gastrointestinal: Positive for abdominal pain.  Genitourinary: Positive for dysuria, frequency and urgency.  All other systems reviewed and are negative.   Physical Exam:  Vital signs in last 24 hours: Temp:  [98.4 F (36.9 C)-98.5 F (36.9 C)] 98.5 F (36.9 C) (10/14 0422) Pulse Rate:  [58-72] 58 (10/14 0422) Resp:  [18-20] 20 (10/14 0422) BP: (141-192)/(59-89) 141/59 (10/14 0422) SpO2:  [97 %-99 %] 97 % (10/14 0422) Weight:  [57.6 kg] 57.6 kg (10/14 0114) Physical Exam  Constitutional: He is oriented to person, place, and time. He appears well-developed and well-nourished.  HENT:  Head: Normocephalic and atraumatic.  Eyes: Pupils are equal, round, and reactive  to light. EOM are normal.  Neck: Normal range of motion. No thyromegaly present.  Cardiovascular: Normal rate and regular rhythm.  Respiratory: Effort normal. No respiratory distress.  GI: Soft. He exhibits no distension. Hernia confirmed negative in the right inguinal area and confirmed negative in the left inguinal area.  Genitourinary: Testes normal and penis normal. Uncircumcised.  Musculoskeletal: Normal range of motion. He exhibits no edema.  Lymphadenopathy:       Right: No inguinal adenopathy present.       Left: No inguinal adenopathy present.  Neurological: He is alert and oriented to person, place, and time.  Skin: Skin is warm and dry.  Psychiatric: He has a normal mood and affect. His behavior is normal. Judgment and thought content normal.    Laboratory Data:  Results for orders placed or performed during the hospital encounter of 08/28/18 (from the past 72 hour(s))  Urinalysis, Routine w reflex microscopic- may I&O cath if menses     Status: Abnormal   Collection Time: 08/28/18  2:49 AM  Result Value Ref Range   Color, Urine YELLOW YELLOW   APPearance TURBID (A) CLEAR   Specific Gravity, Urine 1.012 1.005 - 1.030   pH 8.0 5.0 - 8.0   Glucose, UA NEGATIVE NEGATIVE mg/dL   Hgb urine dipstick MODERATE (A) NEGATIVE   Bilirubin Urine NEGATIVE NEGATIVE   Ketones, ur NEGATIVE NEGATIVE mg/dL   Protein, ur 100 (A) NEGATIVE mg/dL   Nitrite POSITIVE (A) NEGATIVE   Leukocytes, UA LARGE (A) NEGATIVE   RBC /  HPF >50 (H) 0 - 5 RBC/hpf   WBC, UA >50 (H) 0 - 5 WBC/hpf   Bacteria, UA MANY (A) NONE SEEN   Squamous Epithelial / LPF 0-5 0 - 5   WBC Clumps PRESENT    Non Squamous Epithelial 0-5 (A) NONE SEEN    Comment: Performed at [facility], [place] [address], [place], [place] [address]  CBC with Differential     Status: Abnormal   Collection Time: 08/28/18  2:50 AM  Result Value Ref Range   WBC 10.3 4.0 - 10.5 K/uL   RBC 3.73 (L) 4.22 - 5.81 MIL/uL    Hemoglobin 11.4 (L) 13.0 - 17.0 g/dL   HCT 35.1 (L) 39.0 - 52.0 %   MCV 94.1 80.0 - 100.0 fL   MCH 30.6 26.0 - 34.0 pg   MCHC 32.5 30.0 - 36.0 g/dL   RDW 14.6 11.5 - 15.5 %   Platelets 165 150 - 400 K/uL   nRBC 0.0 0.0 - 0.2 %   Neutrophils Relative % 81 %   Neutro Abs 8.3 (H) 1.7 - 7.7 K/uL   Lymphocytes Relative 10 %   Lymphs Abs 1.0 0.7 - 4.0 K/uL   Monocytes Relative 7 %   Monocytes Absolute 0.7 0.1 - 1.0 K/uL   Eosinophils Relative 2 %   Eosinophils Absolute 0.2 0.0 - 0.5 K/uL   Basophils Relative 0 %   Basophils Absolute 0.0 0.0 - 0.1 K/uL   Immature Granulocytes 0 %   Abs Immature Granulocytes 0.04 0.00 - 0.07 K/uL    Comment: Performed at [facility], [place] [address], [place], [place] [address]  Basic metabolic panel     Status: Abnormal   Collection Time: 08/28/18  2:50 AM  Result Value Ref Range   Sodium 142 135 - 145 mmol/L   Potassium 3.7 3.5 - 5.1 mmol/L   Chloride 104 98 - 111 mmol/L   CO2 28 22 - 32 mmol/L   Glucose, Bld 135 (H) 70 - 99 mg/dL   BUN 23 8 - 23 mg/dL   Creatinine, Ser 1.20 0.61 - 1.24 mg/dL   Calcium 9.5 8.9 - 10.3 mg/dL   GFR calc non Af Amer 54 (L) >60 mL/min   GFR calc Af Amer >60 >60 mL/min    Comment: (NOTE) The eGFR has been calculated using the CKD EPI equation. This calculation has not been validated in all clinical situations. eGFR's persistently <60 mL/min signify possible Chronic Kidney Disease.    Anion gap 10 5 - 15    Comment: Performed at [facility], [place] [address], [place], [place] [address]   No results found for this or any previous visit (from the past 240 hour(s)). Creatinine: Recent Labs    08/28/18 0250  CREATININE 1.20    Preprocedure diagnosis: urethral stricture, urinary retention  Postoperative diagnosis: Same  Procedure: 1. Cystoscopy 2. Urethral dialtion from 8 french to 18 french  Attending: Nicolette Bang  Anesthesia: local  History of blood loss:  Minimal  Antibiotics: Ancef  Specimens: none  Drains: 14 french catheter  Findings: dense prostatic  urethral strictures. Short pinhole penile urethral stricture.  No masses/lesions in the bladder.  Indications: Patient is a 84 year old male with a history of difficulty with urination and was found to have a urethral stricture. After discussing treatment options the patient wishes to proceed with urethral dialtion.  Procedure in detail: local anesthesia was administered and patient was placed in supine position.  His genitalia was then prepped and draped in usual sterile fashion. Using the Hagar dilators the urethra was dilated to 18 french. We then passed a 32 French cystoscope up to the prostatic urethra where we encountered a dense calcifications stricture. A sensor wire was passed into bladder. The cystoscope was removed and we then we sequentially dilated the stricture from 8 frecnh to 16 french. We then passed the scope into the bladder. We inspected the bladder and no lesions, masses, or calculi were noted. We then removed the cystoscope and placed an 14 French catheter over the sensor wire.  This then concluded the procedure which was well tolerated by the patient.  Complications: no immediate post procedure complications  Baseline Creatinine: 1.2  Impression/Assessment:  84yo with urethral stricture disease, prostate calcifications and urinary retention  Plan:  1. 14 French foley placed after dilating urethral from 8 french to 16 french which was difficult due to dense prostatic calcifications. The foley is to remain in place for 5 days prior to voiding trial.  Nicolette Bang 08/28/2018, 6:05 AM

## 2018-08-30 LAB — URINE CULTURE: Culture: >=100000 — AB

## 2018-08-31 ENCOUNTER — Telehealth: Payer: Self-pay | Admitting: *Deleted

## 2018-08-31 NOTE — Telephone Encounter (Signed)
Post ED Visit - Positive Culture Follow-up  Culture report reviewed by antimicrobial stewardship pharmacist:  []  Elenor Quinones, Pharm.D. []  Heide Guile, Pharm.D., BCPS AQ-ID []  Parks Neptune, Pharm.D., BCPS []  Alycia Rossetti, Pharm.D., BCPS []  Corder, Pharm.D., BCPS, AAHIVP []  Legrand Como, Pharm.D., BCPS, AAHIVP []  Salome Arnt, PharmD, BCPS []  Johnnette Gourd, PharmD, BCPS []  Hughes Better, PharmD, BCPS []  Leeroy Cha, PharmD Elicia Lamp, PharmD  Positive urine culture Treated with Cephalexin, organism sensitive to the same and no further patient follow-up is required at this time.  Harlon Flor [facility] 08/31/2018, 11:43 AM

## 2019-02-25 ENCOUNTER — Emergency Department (HOSPITAL_COMMUNITY)
Admission: EM | Admit: 2019-02-25 | Discharge: 2019-02-25 | Disposition: A | Discharge status: Home / Self Care | Payer: Medicare Other | Attending: Emergency Medicine | Admitting: Emergency Medicine

## 2019-02-25 ENCOUNTER — Emergency Department (HOSPITAL_COMMUNITY): Payer: Medicare Other

## 2019-02-25 ENCOUNTER — Other Ambulatory Visit: Payer: Self-pay

## 2019-02-25 DIAGNOSIS — Z9181 History of falling: Secondary | ICD-10-CM | POA: Insufficient documentation

## 2019-02-25 DIAGNOSIS — R296 Repeated falls: Secondary | ICD-10-CM

## 2019-02-25 DIAGNOSIS — S0990XA Unspecified injury of head, initial encounter: Secondary | ICD-10-CM | POA: Diagnosis present

## 2019-02-25 DIAGNOSIS — Y92199 Unspecified place in other specified residential institution as the place of occurrence of the external cause: Secondary | ICD-10-CM | POA: Diagnosis not present

## 2019-02-25 DIAGNOSIS — I10 Essential (primary) hypertension: Secondary | ICD-10-CM | POA: Diagnosis not present

## 2019-02-25 DIAGNOSIS — Y939 Activity, unspecified: Secondary | ICD-10-CM | POA: Diagnosis not present

## 2019-02-25 DIAGNOSIS — R109 Unspecified abdominal pain: Secondary | ICD-10-CM

## 2019-02-25 DIAGNOSIS — R1011 Right upper quadrant pain: Secondary | ICD-10-CM | POA: Insufficient documentation

## 2019-02-25 DIAGNOSIS — Z87891 Personal history of nicotine dependence: Secondary | ICD-10-CM | POA: Insufficient documentation

## 2019-02-25 DIAGNOSIS — W19XXXA Unspecified fall, initial encounter: Secondary | ICD-10-CM | POA: Insufficient documentation

## 2019-02-25 DIAGNOSIS — S0083XA Contusion of other part of head, initial encounter: Secondary | ICD-10-CM | POA: Diagnosis not present

## 2019-02-25 DIAGNOSIS — Z79899 Other long term (current) drug therapy: Secondary | ICD-10-CM | POA: Diagnosis not present

## 2019-02-25 DIAGNOSIS — Y999 Unspecified external cause status: Secondary | ICD-10-CM | POA: Diagnosis not present

## 2019-02-25 LAB — CBC WITH DIFFERENTIAL/PLATELET
Abs Immature Granulocytes: 0.03 10*3/uL (ref 0.00–0.07)
Basophils Absolute: 0 10*3/uL (ref 0.0–0.1)
Basophils Relative: 0 %
Eosinophils Absolute: 0.1 10*3/uL (ref 0.0–0.5)
Eosinophils Relative: 1 %
HCT: 46.4 % (ref 39.0–52.0)
Hemoglobin: 15.1 g/dL (ref 13.0–17.0)
Immature Granulocytes: 0 %
Lymphocytes Relative: 9 %
Lymphs Abs: 0.9 10*3/uL (ref 0.7–4.0)
MCH: 31 pg (ref 26.0–34.0)
MCHC: 32.5 g/dL (ref 30.0–36.0)
MCV: 95.3 fL (ref 80.0–100.0)
Monocytes Absolute: 0.8 10*3/uL (ref 0.1–1.0)
Monocytes Relative: 8 %
Neutro Abs: 8.7 10*3/uL — ABNORMAL HIGH (ref 1.7–7.7)
Neutrophils Relative %: 82 %
Platelets: 170 10*3/uL (ref 150–400)
RBC: 4.87 MIL/uL (ref 4.22–5.81)
RDW: 14.1 % (ref 11.5–15.5)
WBC: 10.5 10*3/uL (ref 4.0–10.5)
nRBC: 0 % (ref 0.0–0.2)

## 2019-02-25 LAB — COMPREHENSIVE METABOLIC PANEL
ALT: 26 U/L (ref 0–44)
AST: 67 U/L — ABNORMAL HIGH (ref 15–41)
Albumin: 4.3 g/dL (ref 3.5–5.0)
Alkaline Phosphatase: 82 U/L (ref 38–126)
Anion gap: 9 (ref 5–15)
BUN: 19 mg/dL (ref 8–23)
CO2: 30 mmol/L (ref 22–32)
Calcium: 10.5 mg/dL — ABNORMAL HIGH (ref 8.9–10.3)
Chloride: 102 mmol/L (ref 98–111)
Creatinine, Ser: 1.03 mg/dL (ref 0.61–1.24)
GFR calc Af Amer: >60 mL/min (ref >60)
GFR calc non Af Amer: >60 mL/min (ref >60)
Glucose, Bld: 124 mg/dL — ABNORMAL HIGH (ref 70–99)
Potassium: 3.9 mmol/L (ref 3.5–5.1)
Sodium: 141 mmol/L (ref 135–145)
Total Bilirubin: 1.6 mg/dL — ABNORMAL HIGH (ref 0.3–1.2)
Total Protein: 7.4 g/dL (ref 6.5–8.1)

## 2019-02-25 LAB — URINALYSIS, ROUTINE W REFLEX MICROSCOPIC
Bilirubin Urine: NEGATIVE
Glucose, UA: NEGATIVE mg/dL
Ketones, ur: 20 mg/dL — AB
Nitrite: NEGATIVE
Protein, ur: 100 mg/dL — AB
Specific Gravity, Urine: 1.017 (ref 1.005–1.030)
pH: 6 (ref 5.0–8.0)

## 2019-02-25 LAB — CK: Total CK: 3851 U/L — ABNORMAL HIGH (ref 49–397)

## 2019-02-25 MED ORDER — CEPHALEXIN 500 MG PO CAPS: 500.0000 mg | ORAL_CAPSULE | Freq: Four times a day (QID) | ORAL | 0 refills | Status: AC

## 2019-02-25 MED ORDER — SODIUM CHLORIDE 0.9 % IV SOLN
1.0000 g | Freq: Once | INTRAVENOUS | Status: AC
  Administered 2019-02-25: 1 g via INTRAVENOUS
  Filled 2019-02-25: qty 10

## 2019-02-25 MED ORDER — SODIUM CHLORIDE 0.9 % IV BOLUS
1000.0000 mL | Freq: Once | INTRAVENOUS | Status: AC
  Administered 2019-02-25: 1000 mL via INTRAVENOUS

## 2019-02-25 MED ORDER — ACETAMINOPHEN 325 MG PO TABS
650.0000 mg | ORAL_TABLET | Freq: Once | ORAL | Status: AC
  Administered 2019-02-25: 650 mg via ORAL
  Filled 2019-02-25: qty 2

## 2019-02-25 NOTE — ED Notes (Signed)
Patient verbalizes understanding of discharge instructions. Opportunity for questioning and answers were provided. Armband removed by staff, pt discharged from ED by wheelchair. Pt's daughter called to come pick up pt

## 2019-02-25 NOTE — ED Notes (Signed)
ED Provider at bedside. 

## 2019-02-25 NOTE — ED Provider Notes (Addendum)
[facility] EMERGENCY DEPARTMENT Provider Note   CSN: [id number] Arrival date & time: 02/25/19  1413    History   Chief Complaint Chief Complaint  Patient presents with  . Fall    HPI Andres Murray is a 85 y.o. male who presents the emergency department via EMS assisted living facility.  Patient was found by the bedside this morning.  He states he thinks it happened around midnight but is amnestic to the events that caused him to end up on the floor.  He does have new bruising over the left temple.  He denies any other pain or injuries.  He was ambulatory at the facility onto the stretcher per EMS report.  He denies headache, changes in vision, unilateral weakness, paresthesia.     HPI  Past Medical History:  Diagnosis Date  . Anxiety   . Hyperlipemia   . Hypertension   . Persistent dry cough     Patient Active Problem List   Diagnosis Date Noted  . Mild dementia ([place]) 09/26/2017  . Paresthesia 03/24/2017  . Mild cognitive impairment 11/03/2016  . Anxiety 09/06/2016  . Abnormal brain MRI 09/06/2016  . Chronic cough 09/06/2016    Past Surgical History:  Procedure Laterality Date  . KNEE SURGERY Right   . TONSILLECTOMY          Home Medications    Prior to Admission medications   Medication Sig Start Date End Date Taking? Authorizing Provider  atorvastatin (LIPITOR) 40 MG tablet Take 40 mg by mouth every evening.     [provider]  cephALEXin (KEFLEX) 500 MG capsule Take 1 capsule (500 mg total) by mouth 4 (four) times daily for 10 days. 02/25/19 03/07/19  Kinnie Feil, PA-C  donepezil (ARICEPT) 10 MG tablet Take 1 tablet (10 mg total) by mouth at bedtime. 03/30/18   Marcial Pacas, MD  meclizine (ANTIVERT) 12.5 MG tablet Take 12.5 mg by mouth 2 (two) times daily as needed for dizziness.     [provider]  memantine (NAMENDA) 10 MG tablet Take 1 tablet (10 mg total) by mouth 2 (two) times daily. 03/30/18   Marcial Pacas, MD   mirtazapine (REMERON) 15 MG tablet Take 15 mg by mouth at bedtime. 08/22/18   [provider]  oxyCODONE (ROXICODONE) 5 MG immediate release tablet For moderate pain, take one half tablet every 6 hours; for severe pain, take one full tablet every 6 hours 08/28/18   Duffy Bruce, MD  ranitidine (ZANTAC) 300 MG capsule Take 1 capsule (300 mg total) by mouth every evening. 08/08/18   Kozlow, Donnamarie Poag, MD  triamcinolone cream (KENALOG) 0.1 % Apply 1 application topically 2 (two) times daily.    [provider]  Trospium Chloride 60 MG CP24 Take 60 mg by mouth daily. 08/21/18   [provider]    Family History Family History  Problem Relation Age of Onset  . Diabetes Mother   . Appendicitis Father     Social History Social History   Tobacco Use  . Smoking status: Former Research scientist (life sciences)  . Smokeless tobacco: Never Used  Substance Use Topics  . Alcohol use: Yes    Comment: Occasional beer  . Drug use: No     Allergies   Patient has no known allergies.   Review of Systems Review of Systems  Ten systems reviewed and are negative for acute change, except as noted in the HPI.   Physical Exam Updated Vital Signs BP 133/67  Pulse 86   Temp 100.2 F (37.9 C) (Rectal)   Resp 18   SpO2 99%   Physical Exam Vitals signs and nursing note reviewed.  Constitutional:      General: He is not in acute distress.    Appearance: He is well-developed. He is not diaphoretic.  HENT:     Head: Normocephalic.     Comments: Bruise over the left temple Eyes:     General: No scleral icterus.    Conjunctiva/sclera: Conjunctivae normal.  Neck:     Musculoskeletal: Normal range of motion and neck supple.  Cardiovascular:     Rate and Rhythm: Normal rate and regular rhythm.     Heart sounds: Normal heart sounds.  Pulmonary:     Effort: Pulmonary effort is normal. No respiratory distress.     Breath sounds: Normal breath sounds.  Abdominal:     Palpations: Abdomen is soft.      Tenderness: There is no abdominal tenderness.  Skin:    General: Skin is warm and dry.  Neurological:     Mental Status: He is alert.     Comments: Speech is clear and goal oriented, follows commands Major Cranial nerves without deficit, no facial droop Normal strength in upper and lower extremities bilaterally including dorsiflexion and plantar flexion, strong and equal grip strength Sensation normal to light and sharp touch Moves extremities without ataxia, coordination intact Normal finger to nose and rapid alternating movements Neg romberg, no pronator drift Normal gait    Psychiatric:        Behavior: Behavior normal.      ED Treatments / Results  Labs (all labs ordered are listed, but only abnormal results are displayed) Labs Reviewed  COMPREHENSIVE METABOLIC PANEL - Abnormal; Notable for the following components:      Result Value   Glucose, Bld 124 (*)    Calcium 10.5 (*)    AST 67 (*)    Total Bilirubin 1.6 (*)    All other components within normal limits  CBC WITH DIFFERENTIAL/PLATELET - Abnormal; Notable for the following components:   Neutro Abs 8.7 (*)    All other components within normal limits  URINALYSIS, ROUTINE W REFLEX MICROSCOPIC - Abnormal; Notable for the following components:   APPearance HAZY (*)    Hgb urine dipstick LARGE (*)    Ketones, ur 20 (*)    Protein, ur 100 (*)    Leukocytes,Ua TRACE (*)    Bacteria, UA RARE (*)    All other components within normal limits  CK - Abnormal; Notable for the following components:   Total CK 3,851 (*)    All other components within normal limits  URINE CULTURE    EKG EKG Interpretation  Date/Time:  Sunday February 25 2019 14:25:42 EDT Ventricular Rate:  80 PR Interval:    QRS Duration: 97 QT Interval:  428 QTC Calculation: 494 R Axis:   53 Text Interpretation:  Sinus rhythm Probable left atrial enlargement Inferior infarct, age indeterminate Lateral leads are also involved No significant  change since last tracing Confirmed by Gareth Morgan [phone]) on 02/25/2019 4:18:49 PM   Radiology No results found.  Procedures Procedures (including critical care time)  Medications Ordered in ED Medications  sodium chloride 0.9 % bolus 1,000 mL (0 mLs Intravenous Stopped 02/25/19 1811)  cefTRIAXone (ROCEPHIN) 1 g in sodium chloride 0.9 % 100 mL IVPB (0 g Intravenous Stopped 02/25/19 1930)  acetaminophen (TYLENOL) tablet 650 mg (650 mg Oral Given 02/25/19 1855)  Initial Impression / Assessment and Plan / ED Course  I have reviewed the triage vital signs and the nursing notes.  Pertinent labs & imaging results that were available during my care of the patient were reviewed by me and considered in my medical decision making (see chart for details).  Clinical Course as of Feb 28 1744  Sun Feb 25, 2019  1605 CK Total(!): 3,851 [CG]  1607 Hgb urine dipstick(!): LARGE [CG]  1607 Leukocytes,Ua(!): TRACE [CG]  1607 RBC / HPF: 11-20 [CG]  1607 WBC, UA: 6-10 [CG]  1607 Bacteria, UA(!): RARE [CG]  1821 I was notified by RN that patient may have slipped on his urine and falling to the ground.  I reevaluated him.  No signs of significant trauma.  I spoke to [place], his daughter who states patient frequently states he does not want to go back to [facility] due to being lonely.  He wants to go back and live with his family.  His daughter has explained to him multiple times that their home is not safe.  She is arranging potential relocation to his son's house.  I discussed lab work, imaging and plan to discharge with antibiotics for UTI/early pyelonephritis.  Beth understood work-up, she is a Education officer, museum and admits patient will be safest at [facility].  It is possible that soon patient son may assume care of him and take him to his house.   [CG]    Clinical Course User Index [CG] Kinnie Feil, PA-C      5:45 PM BP 133/67   Pulse 86   Temp 100.2 F (37.9 C) (Rectal)   Resp  18   SpO2 99%    Patient fell sometime last night.  Found on floor by staff.  He was ambulatory by EMS.CT head and C-spine are negative.  CK slightly elevated and has just returned.  Awaiting urinalysis.  Will start fluids.  I have given signout to [place] who will assume care.  Expect discharge.  Final Clinical Impressions(s) / ED Diagnoses   Final diagnoses:  Recurrent falls  Right flank pain    ED Discharge Orders         [place]     02/25/19 1648    Face-to-face encounter (required for Medicare/Medicaid patients)    Comments:  I Kinnie Feil certify that this patient is under my care and that I, or a nurse practitioner or physician's assistant working with me, had a face-to-face encounter that meets the physician face-to-face encounter requirements with this patient on 02/25/2019. The encounter with the patient was in whole, or in part for the following medical condition(s) which is the primary reason for home health care (List medical condition): PAIN WITH AMBULATION, UNSTEADY GAIT, RISK FOR FALL   02/25/19 1648    cephALEXin (KEFLEX) 500 MG capsule  4 times daily     02/25/19 1941           Margarita Mail, PA-C 02/25/19 1527    Margarita Mail, PA-C 02/28/19 1745    Gareth Morgan, MD 03/06/19 [phone]

## 2019-02-25 NOTE — ED Provider Notes (Signed)
1530: Hand off from previous EDPA. Please see previous note for full details. Pt awaiting UA.   1615: I called [facility], reportedly staff went to check on patient and when they went into his apartment he was found on the ground sleeping with a pillow and a blanket over him. He thought he was in his bed and didn't know why he was on the floor.  EMS called, pt got up and ambulated onto stretcher independently.  Facility has been on lock down and they have been unable to do daily check in on patients.  Daughter Eustaquio Maize [phone].  [phone]: Evaluated patient.  He is oriented x4.  States throughout the night he tried to get out of bed but could not, he felt like his legs were weak and he would hurt his right flank to stand up and move.  Denies any changes to his chronic cough, dysuria, hematuria, abdominal pain.  States he has had multiple falls recently when walking his dog. Physical Exam  BP 133/67   Pulse 86   Temp 100.2 F (37.9 C) (Rectal)   Resp 18   SpO2 99%   Physical Exam Vitals signs and nursing note reviewed.  Constitutional:      Appearance: He is well-developed.     Comments: Sitting on side of the bed.  HENT:     Head: Normocephalic and atraumatic.     Nose: Nose normal.  Eyes:     Conjunctiva/sclera: Conjunctivae normal.  Neck:     Musculoskeletal: Normal range of motion.  Cardiovascular:     Rate and Rhythm: Normal rate and regular rhythm.     Heart sounds: Normal heart sounds.  Pulmonary:     Effort: Pulmonary effort is normal.     Breath sounds: Normal breath sounds.  Abdominal:     General: Bowel sounds are normal.     Palpations: Abdomen is soft.     Tenderness: There is abdominal tenderness. There is right CVA tenderness.     Comments: Right CVAT.  No tenderness with just palpation.  No ecchymosis to the back.  No suprapubic or left CVA tenderness.  Abdomen soft.  Musculoskeletal: Normal range of motion.  Skin:    General: Skin is warm and dry.     Capillary  Refill: Capillary refill takes less than 2 seconds.  Neurological:     Mental Status: He is alert.     Comments: Patient able to stand up from the chair intake a few steps independently.  Sitting up and moving causes him to have right thoracic back pain. Alert and oriented to self, place, time and event. Speech is fluent without dysarthria or dysphasia. Strength 5/5 with hand grip and ankle F/E.  Sensation to light touch intact in face, hands and feet.  Psychiatric:        Behavior: Behavior normal.     ED Course/Procedures   Clinical Course as of Feb 25 1944  Sun Feb 25, 2019  1605 CK Total(!): 3,851 [CG]  1607 Hgb urine dipstick(!): LARGE [CG]  1607 Leukocytes,Ua(!): TRACE [CG]  1607 RBC / HPF: 11-20 [CG]  1607 WBC, UA: 6-10 [CG]  1607 Bacteria, UA(!): RARE [CG]  1821 I was notified by RN that patient may have slipped on his urine and falling to the ground.  I reevaluated him.  No signs of significant trauma.  I spoke to [place], his daughter who states patient frequently states he does not want to go back to [facility] due to being  lonely.  He wants to go back and live with his family.  His daughter has explained to him multiple times that their home is not safe.  She is arranging potential relocation to his son's house.  I discussed lab work, imaging and plan to discharge with antibiotics for UTI/early pyelonephritis.  Beth understood work-up, she is a Education officer, museum and admits patient will be safest at [facility].  It is possible that soon patient son may assume care of him and take him to his house.   [CG]    Clinical Course User Index [CG] Kinnie Feil, PA-C    Procedures  MDM   1630: UA with 11-20 RBCs, 6-10 WBCs, rare bacteria, trace leuks.  He has no urinary symptoms but reports RCVAT.  Will obtain CT renal to r/o stone vs stranding. Oral temp 99 F, will check rectal temp.   1937: Pt reports falling in ED but this was not witnessed.  I evaluated him and he had no  signs of head, CTL spine, pelvis or extremity injury. No indication for imaging for fall.  He continues to complain of R flank pain. I ambulated him again and he was able to do so without assistance.  CT renal unremarkable.  Given rectal temp of 100.2, RCVAT, suspicious UA will treat for UTI vs early pyelo with rocephin IV and rx for this at home.  I discussed this plan with daughter who is in agreement.  Family has made arrangements for patient to return to live from home, daughter will actually pick pt up from ED after IV abx. Daughter called me to communicate this. Pt appropriate for discharge after IV abx.    Kinnie Feil, PA-C 02/25/19 1945    Veryl Speak, MD 02/25/19 2225

## 2019-02-25 NOTE — ED Notes (Signed)
Pt secured in bed with call bell and phone within reach. Informed to press call bell if needing assistance. Door left open

## 2019-02-25 NOTE — ED Notes (Signed)
Patient transported to CT 

## 2019-02-25 NOTE — ED Notes (Signed)
Pt given food and diet ginger ale  

## 2019-02-25 NOTE — ED Notes (Signed)
This RN walked by pts room and found pt standing at foot of the bed, this rn walked in and asked the pt what he was doing. Pt stated he was trying to use the urinal and he spilled urine on the floor and fell. When this rn saw the pt he was already standing up. Pt reports injury to right arm, no deformity noted. EDP made aware to reassess. Pt given yellow grip socks and instructed on how to use call bell.

## 2019-02-25 NOTE — Discharge Instructions (Addendum)
You were seen in the ED for possible fall.  You reported right flank pain.  CT showed some stable small kidney stones but none that are moving down towards bladder.  Kidneys otherwise look ok on CT. Kidney function is normal. Liver function looks normal.  Your urine looked slightly infected, given your pain we will treat you for an early UTI or kidney infection. This may be causing your right flank pain.  Take antibiotics as prescribed. Take 650 mg acetaminophen (tylenol) every 8 hours for pain. Unfortunately you reported an unwitnessed fall in the ED however you did not have any signs of trauma from this.   Return to the ED for fever greater than 100.4, vomiting, worsening flank pain, difficulty urinating, cough, chest pain or shortness of breath  You should have a telemedicine visit with your primary care provider in 48 hours to ensure symptoms are improving and there is no worsening clinical decline

## 2019-02-25 NOTE — ED Triage Notes (Signed)
Pt here after being found on floor at [facility] independent living. Pt doesn't know how he got there. Abrasion to L forehead.

## 2019-02-25 NOTE — ED Notes (Signed)
Pt in ct 

## 2019-02-28 ENCOUNTER — Telehealth: Payer: Self-pay | Admitting: Neurology

## 2019-02-28 LAB — URINE CULTURE

## 2019-02-28 NOTE — Telephone Encounter (Signed)
Noted  

## 2019-02-28 NOTE — Telephone Encounter (Signed)
Please call patient's daughter Jonita Albee to set up a virtual appt. Patient was seen at ED following a fall, but remain confused, has difficulty walking

## 2019-02-28 NOTE — Telephone Encounter (Signed)
I called and left a detailed message for Beth, advising her to call us back and we can set patient up for a Virtual Visit tomorrow if she would like. She had one today with Dr. Krista Blue so she is familiar with how it works. I just need an email address and he can be put on the schedule at a time that works for her.

## 2019-03-01 NOTE — Telephone Encounter (Signed)
I spoke with patient's daughter and was able to get him set up for 4/21 at 1:00p. I have sent her Webex invitation to [email or web address]. I advised her to call me back if she does not receive the email.

## 2019-03-06 ENCOUNTER — Ambulatory Visit (INDEPENDENT_AMBULATORY_CARE_PROVIDER_SITE_OTHER): Payer: Medicare Other | Admitting: Neurology

## 2019-03-06 ENCOUNTER — Encounter: Payer: Self-pay | Admitting: Neurology

## 2019-03-06 ENCOUNTER — Other Ambulatory Visit: Payer: Self-pay

## 2019-03-06 DIAGNOSIS — F0390 Unspecified dementia without behavioral disturbance: Secondary | ICD-10-CM | POA: Diagnosis not present

## 2019-03-06 DIAGNOSIS — R269 Unspecified abnormalities of gait and mobility: Secondary | ICD-10-CM

## 2019-03-06 NOTE — Progress Notes (Signed)
PATIENT: Andres Murray DOB: 1934-04-28  Chief Complaint  Patient presents with  . [name]    He was referred here to discuss his symptoms of daily anxiety and nervousness.  States he has also developed a persistant, dry cough over the last six months.  [name] PCP    [name], MD     HISTORICAL  Andres Murray is a 85 years old right-handed male, seen in refer by his primary care [facility] for evaluation of anxiety, frequent dry cough, initial evaluation was on September 06 2016.   I reviewed and summarized the referral note, he had a history of hyperlipidemia, hypertension, has been on combination of amlodipine, valsartan treatment for more than 10 years, he is going through a lot of stress recently, he is going to move out of his house to assistant living around January 2018, he has to give up his dog, his wife suffered memory loss, he was recently diagnosed with prostate cancer in August 2017, will have treatment soon.  Around spring of 2017, he began to notice worsening anxiety, feeling nervousness, frequent dry cough, body movement, difficulty falling to sleep, over past few months, he has been taking Tylenol or Advil PM, which helped her symptoms some.  During interview, he was noticed to have frequent dry cough, shoulder shrugging body twisting, tic-like abnormal movement, he denied previous history of tic  He denies visual loss, no significant memory loss, he is a retired school principal at age 41, still enjoys reading,  We have personally reviewed MRI of the brain without contrast in 2014/02/11, extensive periventricular white matter disease consistent with small vessel disease, incidental finding of quadrigeminal plate lipoma  UPDATE Nov 03 2016: He is now taking nortriptyline 50mg  qhs, which has helped him sleep, but he still has difficulty sleepiness.  During the day, he still has frequent body jerking, clear his throat, he complains of depression anxiety, chronic  insomnia,  We have personally reviewed MRI scan on October 11 2016, moderate perisylvian atrophy, mild to moderate periventricular and subcortical chronic small vessel disease, mild progression compared to previous scan in February 11, 2014  UPDATE Mar 24 2017: He had prostate cancer, he had seeding procedure, also suffered kidney stone, had a recent procedure, he wore Foley cath today, he has moved to independent living at [facility], his wife passed away in 02/11/2017, he is tearful,  Complains of worsening anxiety, motor and vocal tics, difficulty sleeping, he is also confused about the medication he is taking, complains of dizziness, dry mouth,  He also complains few weeks history of intermittent right hand paresthesia mainly involving fourth fifth fingers, radiating to right arm, he denies neck pain,  UPDATE Sep 26 2017: We have reviewed MRI brain in Nov 2017, mild atrophy, small vessel disease.  He now lives at independent living, with worsening memory loss, drives to clinic today.  He takes melatonin for insominia.  UPDATE Mar 30 2018: He is overall doing very well, lives at independent living at [facility], drive to clinic today, continue has mild memory loss, was noted to be anxious during today's interview, has frequent motor tics, vocal tics, shrugging his right shoulder, clear his throat frequently, no longer on any anti-anxiety medications.  Laboratory evaluation seen 2018-02-11 showed normal CMP with glucose of 131, normal vitamin D 45, B12, TSH, negative RPR,  Virtual Visit via video  I connected with Andres Murray on 03/06/19 at  by video and verified that I am speaking with the  correct person using two identifiers.   I discussed the limitations, risks, security and privacy concerns of performing an evaluation and management service by video and the availability of in person appointments. I also discussed with the patient that there may be a patient responsible charge related to this  service. The patient expressed understanding and agreed to proceed.   History of Present Illness: Patient used to lives at [facility] independent living, daughter reported gradual decline in functional status, he presented to the emergency room on February 25, 2019, was found by his bedside in the morning, but patient has amnesia of the event,  he does have new bruise over his left temporal region,  I personally reviewed CT head without contrast in April 2020, progressive atrophy, small vessel disease, chronic left frontal infarction, no acute abnormality.   CT of cervical spine multilevel degenerative changes, no evidence of fracture  Family is in the process of moving him to [facility] assisted living, he was noted to have worsening confusion, gait abnormality, daughter really concerned about his ability and designed to continue to drive.   Observations/Objective: I have reviewed problem lists, medications, allergies.  MMSE - Mini Mental State Exam 03/06/2019 03/30/2018 09/26/2017  Orientation to time 5 5 5   Orientation to Place 5 5 5   Registration 3 3 3   Attention/ Calculation 3 5 2   Recall 3 2 0  Language- name 2 objects 2 2 2   Language- repeat 1 1 1   Language- follow 3 step command 3 2 3   Language- read & follow direction 1 1 1   Write a sentence Not performed 1 1  Copy design Not performed 0 1  Total score 26 27 24    He was able to push up on chair arm, mildly unsteady Assessment and Plan:  Mild cognitive impairment  MMSE 26/28  MRI of the brain in November 2017, mild generalized atrophy, supratentorium small vessel disease  Worsening memory loss, functional status per daughter, fell for unknown reasons on December 27, 2018,  I have emphasized with patient, he should not drive until I reevaluate him again in 6 months,  He was not able to manage his medications, not taking Namenda and Aricept, will put that on hold,  Follow Up Instructions:  6 months  I discussed the  assessment and treatment plan with the patient. The patient was provided an opportunity to ask questions and all were answered. The patient agreed with the plan and demonstrated an understanding of the instructions.   The patient was advised to call back or seek an in-person evaluation if the symptoms worsen or if the condition fails to improve as anticipated.  I provided 30 minutes of non-face-to-face time during this encounter.   Marcial Pacas, MD  REVIEW OF SYSTEMS: Full 14 system review of systems performed and notable only for: As above ALLERGIES: No Known Allergies  HOME MEDICATIONS: Current Outpatient Medications  Medication Sig Dispense Refill  . atorvastatin (LIPITOR) 40 MG tablet Take 40 mg by mouth every evening.     . cephALEXin (KEFLEX) 500 MG capsule Take 1 capsule (500 mg total) by mouth 4 (four) times daily for 10 days. 40 capsule 0  . donepezil (ARICEPT) 10 MG tablet Take 1 tablet (10 mg total) by mouth at bedtime. 90 tablet 4  . meclizine (ANTIVERT) 12.5 MG tablet Take 12.5 mg by mouth 2 (two) times daily as needed for dizziness.     . memantine (NAMENDA) 10 MG tablet Take 1 tablet (10 mg total) by  mouth 2 (two) times daily. 180 tablet 4  . mirtazapine (REMERON) 15 MG tablet Take 15 mg by mouth at bedtime.  1  . oxyCODONE (ROXICODONE) 5 MG immediate release tablet For moderate pain, take one half tablet every 6 hours; for severe pain, take one full tablet every 6 hours 15 tablet 0  . ranitidine (ZANTAC) 300 MG capsule Take 1 capsule (300 mg total) by mouth every evening. 30 capsule 5  . triamcinolone cream (KENALOG) 0.1 % Apply 1 application topically 2 (two) times daily.    . Trospium Chloride 60 MG CP24 Take 60 mg by mouth daily.  6   No current facility-administered medications for this visit.     PAST MEDICAL HISTORY: Past Medical History:  Diagnosis Date  . Anxiety   . Hyperlipemia   . Hypertension   . Persistent dry cough     PAST SURGICAL HISTORY: Past  Surgical History:  Procedure Laterality Date  . KNEE SURGERY Right   . TONSILLECTOMY      FAMILY HISTORY: Family History  Problem Relation Age of Onset  . Diabetes Mother   . Appendicitis Father     SOCIAL HISTORY:  Social History   Socioeconomic History  . Marital status: Married    Spouse name: Not on file  . Number of children: 2  . Years of education: Doctorate  . Highest education level: Not on file  Occupational History  . Occupation: Retired  Scientific laboratory technician  . Financial resource strain: Not on file  . Food insecurity:    Worry: Not on file    Inability: Not on file  . Transportation needs:    Medical: Not on file    Non-medical: Not on file  Tobacco Use  . Smoking status: Former Research scientist (life sciences)  . Smokeless tobacco: Never Used  Substance and Sexual Activity  . Alcohol use: Yes    Comment: Occasional beer  . Drug use: No  . Sexual activity: Not on file  Lifestyle  . Physical activity:    Days per week: Not on file    Minutes per session: Not on file  . Stress: Not on file  Relationships  . Social connections:    Talks on phone: Not on file    Gets together: Not on file    Attends religious service: Not on file    Active member of club or organization: Not on file    Attends meetings of clubs or organizations: Not on file    Relationship status: Not on file  . Intimate partner violence:    Fear of current or ex partner: Not on file    Emotionally abused: Not on file    Physically abused: Not on file    Forced sexual activity: Not on file  Other Topics Concern  . Not on file  Social History Narrative   Lives at home with wife, Andres Murray.   Right-handed.   4-5 cups caffeine daily.     PHYSICAL EXAM   There were no vitals filed for this visit.  Not recorded      There is no height or weight on file to calculate BMI.  PHYSICAL EXAMNIATION:  Gen: NAD, conversant, well nourised, obese, well groomed                     Cardiovascular: Regular rate rhythm,  no peripheral edema, warm, nontender. Eyes: Conjunctivae clear without exudates or hemorrhage Neck: Supple, no carotid bruits. Pulmonary: Clear to auscultation bilaterally  NEUROLOGICAL EXAM:  MMSE - Mini Mental State Exam 03/30/2018 09/26/2017  Orientation to time 5 5  Orientation to Place 5 5  Registration 3 3  Attention/ Calculation 5 2  Recall 2 0  Language- name 2 objects 2 2  Language- repeat 1 1  Language- follow 3 step command 2 3  Language- read & follow direction 1 1  Write a sentence 1 1  Copy design 0 1  Total score 27 24    MENTAL STATUS:Anxious looking elderly male, with frequent dry cough, body movement, shoulder shrugging, suggestive of motor tics,    CRANIAL NERVES: CN II: Visual fields are full to confrontation. Fundoscopic exam is normal with sharp discs and no vascular changes. Pupils are round equal and briskly reactive to light. CN III, IV, VI: extraocular movement are normal. No ptosis. CN V: Facial sensation is intact to pinprick in all 3 divisions bilaterally. Corneal responses are intact.  CN VII: Face is symmetric with normal eye closure and smile. CN VIII: Hearing is normal to rubbing fingers CN IX, X: Palate elevates symmetrically. Phonation is normal. CN XI: Head turning and shoulder shrug are intact CN XII: Tongue is midline with normal movements and no atrophy.  MOTOR: There is no pronator drift of out-stretched arms. Muscle bulk and tone are normal. Muscle strength is normal.  REFLEXES: Reflexes are 2+ and symmetric at the biceps, triceps, knees, and ankles. Plantar responses are flexor.  SENSORY: Intact to light touch, pinprick, positional sensation and vibratory sensation are intact in fingers and toes.  COORDINATION: Rapid alternating movements and fine finger movements are intact. There is no dysmetria on finger-to-nose and heel-knee-shin.    GAIT/STANCE: Need to push up from seated position, mildly cautious, wearing Foley catheters  with bloody draine today Romberg is absent.   DIAGNOSTIC DATA (LABS, IMAGING, TESTING) - I reviewed patient records, labs, notes, testing and imaging myself where available.   ASSESSMENT AND PLAN  Mister Krahenbuhl is a 85 y.o. male  Mild cognitive impairment  MMSE 27/30  MRI of the brain in November 2017, mild generalized atrophy, supratentorium small vessel disease  Keep Aricept 10mg  daily, Namenda 10mg  bid  Laboratory evaluation for treatable etiology    Worsening anxiety, motor and vocal tics, chronic insomnia   He may take melatonin for insomnia   Continue follow-up with his primary care physician  Marcial Pacas, M.D. Ph.D.  [facility] [address], [place], [place] [address] Ph: [phone] Fax: [phone]  CC: Referring Provider

## 2019-03-27 ENCOUNTER — Inpatient Hospital Stay (HOSPITAL_COMMUNITY)
Admission: EM | Admit: 2019-03-27 | Discharge: 2019-03-28 | DRG: 305 | Disposition: A | Discharge status: Skilled Nursing Facility | Payer: Medicare Other | Attending: Family Medicine | Admitting: Family Medicine

## 2019-03-27 ENCOUNTER — Emergency Department (HOSPITAL_COMMUNITY): Payer: Medicare Other

## 2019-03-27 ENCOUNTER — Encounter (HOSPITAL_COMMUNITY): Payer: Self-pay | Admitting: Family Medicine

## 2019-03-27 ENCOUNTER — Other Ambulatory Visit: Payer: Self-pay

## 2019-03-27 DIAGNOSIS — R05 Cough: Secondary | ICD-10-CM | POA: Diagnosis present

## 2019-03-27 DIAGNOSIS — E785 Hyperlipidemia, unspecified: Secondary | ICD-10-CM | POA: Diagnosis present

## 2019-03-27 DIAGNOSIS — Z87891 Personal history of nicotine dependence: Secondary | ICD-10-CM | POA: Diagnosis not present

## 2019-03-27 DIAGNOSIS — G3184 Mild cognitive impairment, so stated: Secondary | ICD-10-CM

## 2019-03-27 DIAGNOSIS — Z79899 Other long term (current) drug therapy: Secondary | ICD-10-CM | POA: Diagnosis not present

## 2019-03-27 DIAGNOSIS — R269 Unspecified abnormalities of gait and mobility: Secondary | ICD-10-CM | POA: Diagnosis present

## 2019-03-27 DIAGNOSIS — Z8546 Personal history of malignant neoplasm of prostate: Secondary | ICD-10-CM

## 2019-03-27 DIAGNOSIS — R7303 Prediabetes: Secondary | ICD-10-CM | POA: Diagnosis present

## 2019-03-27 DIAGNOSIS — R202 Paresthesia of skin: Secondary | ICD-10-CM | POA: Diagnosis present

## 2019-03-27 DIAGNOSIS — H538 Other visual disturbances: Secondary | ICD-10-CM | POA: Diagnosis present

## 2019-03-27 DIAGNOSIS — R339 Retention of urine, unspecified: Secondary | ICD-10-CM | POA: Diagnosis present

## 2019-03-27 DIAGNOSIS — R42 Dizziness and giddiness: Secondary | ICD-10-CM | POA: Diagnosis present

## 2019-03-27 DIAGNOSIS — Z1159 Encounter for screening for other viral diseases: Secondary | ICD-10-CM | POA: Diagnosis not present

## 2019-03-27 DIAGNOSIS — F419 Anxiety disorder, unspecified: Secondary | ICD-10-CM | POA: Diagnosis present

## 2019-03-27 DIAGNOSIS — I161 Hypertensive emergency: Secondary | ICD-10-CM | POA: Diagnosis present

## 2019-03-27 DIAGNOSIS — R053 Chronic cough: Secondary | ICD-10-CM | POA: Diagnosis present

## 2019-03-27 DIAGNOSIS — F0390 Unspecified dementia without behavioral disturbance: Secondary | ICD-10-CM

## 2019-03-27 DIAGNOSIS — I160 Hypertensive urgency: Secondary | ICD-10-CM | POA: Diagnosis not present

## 2019-03-27 DIAGNOSIS — Z79891 Long term (current) use of opiate analgesic: Secondary | ICD-10-CM | POA: Diagnosis not present

## 2019-03-27 DIAGNOSIS — I10 Essential (primary) hypertension: Secondary | ICD-10-CM

## 2019-03-27 DIAGNOSIS — R739 Hyperglycemia, unspecified: Secondary | ICD-10-CM | POA: Diagnosis present

## 2019-03-27 LAB — CBC WITH DIFFERENTIAL/PLATELET
Abs Immature Granulocytes: 0.02 10*3/uL (ref 0.00–0.07)
Basophils Absolute: 0 10*3/uL (ref 0.0–0.1)
Basophils Relative: 0 %
Eosinophils Absolute: 0.1 10*3/uL (ref 0.0–0.5)
Eosinophils Relative: 1 %
HCT: 41.9 % (ref 39.0–52.0)
Hemoglobin: 13.8 g/dL (ref 13.0–17.0)
Immature Granulocytes: 0 %
Lymphocytes Relative: 10 %
Lymphs Abs: 0.7 10*3/uL (ref 0.7–4.0)
MCH: 30.9 pg (ref 26.0–34.0)
MCHC: 32.9 g/dL (ref 30.0–36.0)
MCV: 93.9 fL (ref 80.0–100.0)
Monocytes Absolute: 0.5 10*3/uL (ref 0.1–1.0)
Monocytes Relative: 8 %
Neutro Abs: 5.6 10*3/uL (ref 1.7–7.7)
Neutrophils Relative %: 81 %
Platelets: 141 10*3/uL — ABNORMAL LOW (ref 150–400)
RBC: 4.46 MIL/uL (ref 4.22–5.81)
RDW: 13.2 % (ref 11.5–15.5)
WBC: 6.9 10*3/uL (ref 4.0–10.5)
nRBC: 0 % (ref 0.0–0.2)

## 2019-03-27 LAB — TROPONIN I
Troponin I: 0.03 ng/mL (ref <0.03)
Troponin I: 0.03 ng/mL (ref <0.03)

## 2019-03-27 LAB — COMPREHENSIVE METABOLIC PANEL
ALT: 18 U/L (ref 0–44)
AST: 21 U/L (ref 15–41)
Albumin: 3.6 g/dL (ref 3.5–5.0)
Alkaline Phosphatase: 53 U/L (ref 38–126)
Anion gap: 10 (ref 5–15)
BUN: 16 mg/dL (ref 8–23)
CO2: 26 mmol/L (ref 22–32)
Calcium: 9.4 mg/dL (ref 8.9–10.3)
Chloride: 105 mmol/L (ref 98–111)
Creatinine, Ser: 1 mg/dL (ref 0.61–1.24)
GFR calc Af Amer: >60 mL/min (ref >60)
GFR calc non Af Amer: >60 mL/min (ref >60)
Glucose, Bld: 141 mg/dL — ABNORMAL HIGH (ref 70–99)
Potassium: 3.5 mmol/L (ref 3.5–5.1)
Sodium: 141 mmol/L (ref 135–145)
Total Bilirubin: 0.7 mg/dL (ref 0.3–1.2)
Total Protein: 6.5 g/dL (ref 6.5–8.1)

## 2019-03-27 LAB — BRAIN NATRIURETIC PEPTIDE: B Natriuretic Peptide: 204.4 pg/mL — ABNORMAL HIGH (ref 0.0–100.0)

## 2019-03-27 LAB — SARS CORONAVIRUS 2 BY RT PCR (HOSPITAL ORDER, PERFORMED IN ~~LOC~~ HOSPITAL LAB): SARS Coronavirus 2: NEGATIVE

## 2019-03-27 LAB — TSH: TSH: 1.28 u[IU]/mL (ref 0.350–4.500)

## 2019-03-27 LAB — MAGNESIUM: Magnesium: 1.9 mg/dL (ref 1.7–2.4)

## 2019-03-27 MED ORDER — MELATONIN 5 MG PO TABS
20.0000 mg | ORAL_TABLET | Freq: Every day | ORAL | Status: DC
  Filled 2019-03-27: qty 4

## 2019-03-27 MED ORDER — POLYETHYLENE GLYCOL 3350 17 G PO PACK: 17.0000 g | PACK | Freq: Every day | ORAL | Status: DC | PRN

## 2019-03-27 MED ORDER — METOPROLOL SUCCINATE ER 25 MG PO TB24
25.0000 mg | ORAL_TABLET | Freq: Every day | ORAL | Status: DC
  Filled 2019-03-27: qty 1

## 2019-03-27 MED ORDER — DONEPEZIL HCL 10 MG PO TABS: 10.0000 mg | ORAL_TABLET | Freq: Every day | ORAL | Status: DC

## 2019-03-27 MED ORDER — AMLODIPINE BESYLATE 10 MG PO TABS
10.0000 mg | ORAL_TABLET | Freq: Every day | ORAL | Status: DC
  Administered 2019-03-27: 10 mg via ORAL
  Filled 2019-03-27: qty 1

## 2019-03-27 MED ORDER — ACETAMINOPHEN 325 MG PO TABS: 650.0000 mg | ORAL_TABLET | Freq: Four times a day (QID) | ORAL | Status: DC | PRN

## 2019-03-27 MED ORDER — LABETALOL HCL 5 MG/ML IV SOLN: 10.0000 mg | INTRAVENOUS | Status: DC | PRN

## 2019-03-27 MED ORDER — MEMANTINE HCL 10 MG PO TABS
10.0000 mg | ORAL_TABLET | Freq: Two times a day (BID) | ORAL | Status: DC
  Administered 2019-03-27 – 2019-03-28 (×2): 10 mg via ORAL
  Filled 2019-03-27 (×4): qty 1

## 2019-03-27 MED ORDER — MELATONIN 3 MG PO TABS
18.0000 mg | ORAL_TABLET | Freq: Every day | ORAL | Status: DC
  Administered 2019-03-27: 18 mg via ORAL
  Filled 2019-03-27 (×2): qty 6

## 2019-03-27 MED ORDER — ATORVASTATIN CALCIUM 10 MG PO TABS
20.0000 mg | ORAL_TABLET | Freq: Every day | ORAL | Status: DC
  Administered 2019-03-27: 20 mg via ORAL
  Filled 2019-03-27: qty 2

## 2019-03-27 MED ORDER — ACETAMINOPHEN 650 MG RE SUPP: 650.0000 mg | Freq: Four times a day (QID) | RECTAL | Status: DC | PRN

## 2019-03-27 MED ORDER — LABETALOL HCL 5 MG/ML IV SOLN
10.0000 mg | Freq: Once | INTRAVENOUS | Status: AC
  Administered 2019-03-27: 10 mg via INTRAVENOUS
  Filled 2019-03-27: qty 4

## 2019-03-27 MED ORDER — PANTOPRAZOLE SODIUM 20 MG PO TBEC
20.0000 mg | DELAYED_RELEASE_TABLET | Freq: Every day | ORAL | Status: DC
  Administered 2019-03-28: 20 mg via ORAL
  Filled 2019-03-27: qty 1

## 2019-03-27 MED ORDER — ENOXAPARIN SODIUM 40 MG/0.4ML ~~LOC~~ SOLN
40.0000 mg | SUBCUTANEOUS | Status: DC
  Administered 2019-03-27: 40 mg via SUBCUTANEOUS
  Filled 2019-03-27: qty 0.4

## 2019-03-27 MED ORDER — TAMSULOSIN HCL 0.4 MG PO CAPS
0.4000 mg | ORAL_CAPSULE | Freq: Every day | ORAL | Status: DC
  Administered 2019-03-28: 0.4 mg via ORAL
  Filled 2019-03-27: qty 1

## 2019-03-27 MED ORDER — OMEPRAZOLE MAGNESIUM 20 MG PO TBEC: 20.0000 mg | DELAYED_RELEASE_TABLET | Freq: Every day | ORAL | Status: DC

## 2019-03-27 NOTE — ED Notes (Signed)
ED TO INPATIENT HANDOFF REPORT  ED Nurse Name and Phone #: Judson Roch [phone]  S Name/Age/Gender Andres Murray 85 y.o. male Room/Bed: 035C/035C  Code Status   Code Status: Not on file  Home/SNF/Other Home Patient oriented to: self, place, time and situation Is this baseline? Yes   Triage Complete: Triage complete  Chief Complaint Hypertension with blurred vision  Triage Note Pt here from [facility] assisted living for blurred vision and dizziness x 3 days. Bp 341 systolic at facility this morning. Compliant with metoprolol.    Allergies No Known Allergies  Level of Care/Admitting Diagnosis ED Disposition    ED Disposition Condition Comment   Admit  Hospital Area: [facility] [[id number]]  Level of Care: Telemetry Cardiac [103]  Covid Evaluation: Screening Protocol (No Symptoms)  Diagnosis: Hypertensive emergency without congestive heart failure [[id number]]  Admitting Physician: Wilber Oliphant [[id number]]  Attending Physician: Dickie La [[id number]]  Estimated length of stay: past midnight tomorrow  Certification:: I certify this patient will need inpatient services for at least 2 midnights  PT Class (Do Not Modify): Inpatient [101]  PT Acc Code (Do Not Modify): Private [1]       B Medical/Surgery History Past Medical History:  Diagnosis Date  . Anxiety   . Hyperlipemia   . Hypertension   . Persistent dry cough    Past Surgical History:  Procedure Laterality Date  . KNEE SURGERY Right   . TONSILLECTOMY       A IV Location/Drains/Wounds Patient Lines/Drains/Airways Status   Active Line/Drains/Airways    Name:   Placement date:   Placement time:   Site:   Days:   Peripheral IV 03/27/19 Left Antecubital   03/27/19    1251    Antecubital   less than 1   External Urinary Catheter   03/27/19    1702    -   less than 1          Intake/Output Last 24 hours No intake or output data in the 24 hours ending 03/27/19 1708  Labs/Imaging Results for orders  placed or performed during the hospital encounter of 03/27/19 (from the past 48 hour(s))  Comprehensive metabolic panel     Status: Abnormal   Collection Time: 03/27/19  1:32 PM  Result Value Ref Range   Sodium 141 135 - 145 mmol/L   Potassium 3.5 3.5 - 5.1 mmol/L   Chloride 105 98 - 111 mmol/L   CO2 26 22 - 32 mmol/L   Glucose, Bld 141 (H) 70 - 99 mg/dL   BUN 16 8 - 23 mg/dL   Creatinine, Ser 1.00 0.61 - 1.24 mg/dL   Calcium 9.4 8.9 - 10.3 mg/dL   Total Protein 6.5 6.5 - 8.1 g/dL   Albumin 3.6 3.5 - 5.0 g/dL   AST 21 15 - 41 U/L   ALT 18 0 - 44 U/L   Alkaline Phosphatase 53 38 - 126 U/L   Total Bilirubin 0.7 0.3 - 1.2 mg/dL   GFR calc non Af Amer >60 >60 mL/min   GFR calc Af Amer >60 >60 mL/min   Anion gap 10 5 - 15    Comment: Performed at [facility], [address], [place], [place] [address]  CBC with Differential     Status: Abnormal   Collection Time: 03/27/19  1:32 PM  Result Value Ref Range   WBC 6.9 4.0 - 10.5 K/uL   RBC 4.46 4.22 - 5.81 MIL/uL   Hemoglobin 13.8  13.0 - 17.0 g/dL   HCT 41.9 39.0 - 52.0 %   MCV 93.9 80.0 - 100.0 fL   MCH 30.9 26.0 - 34.0 pg   MCHC 32.9 30.0 - 36.0 g/dL   RDW 13.2 11.5 - 15.5 %   Platelets 141 (L) 150 - 400 K/uL   nRBC 0.0 0.0 - 0.2 %   Neutrophils Relative % 81 %   Neutro Abs 5.6 1.7 - 7.7 K/uL   Lymphocytes Relative 10 %   Lymphs Abs 0.7 0.7 - 4.0 K/uL   Monocytes Relative 8 %   Monocytes Absolute 0.5 0.1 - 1.0 K/uL   Eosinophils Relative 1 %   Eosinophils Absolute 0.1 0.0 - 0.5 K/uL   Basophils Relative 0 %   Basophils Absolute 0.0 0.0 - 0.1 K/uL   Immature Granulocytes 0 %   Abs Immature Granulocytes 0.02 0.00 - 0.07 K/uL    Comment: Performed at [facility] [address] [place], [place] [address]  Troponin I - Once     Status: Abnormal   Collection Time: 03/27/19  1:32 PM  Result Value Ref Range   Troponin I 0.03 (HH) <0.03 ng/mL    Comment: CRITICAL RESULT CALLED TO, READ BACK BY AND VERIFIED  WITH: S.Jadis Pitter RN 1432 03/27/2019 MCCORMICK K Performed at [facility] [address], [place], [place] [address]   SARS Coronavirus 2 (CEPHEID - Performed in [facility] hospital lab), Hosp Order     Status: None   Collection Time: 03/27/19  3:46 PM  Result Value Ref Range   SARS Coronavirus 2 NEGATIVE NEGATIVE    Comment: (NOTE) If result is NEGATIVE SARS-CoV-2 target nucleic acids are NOT DETECTED. The SARS-CoV-2 RNA is generally detectable in upper and lower  respiratory specimens during the acute phase of infection. The lowest  concentration of SARS-CoV-2 viral copies this assay can detect is 250  copies / mL. A negative result does not preclude SARS-CoV-2 infection  and should not be used as the sole basis for treatment or other  patient management decisions.  A negative result may occur with  improper specimen collection / handling, submission of specimen other  than nasopharyngeal swab, presence of viral mutation(s) within the  areas targeted by this assay, and inadequate number of viral copies  (<250 copies / mL). A negative result must be combined with clinical  observations, patient history, and epidemiological information. If result is POSITIVE SARS-CoV-2 target nucleic acids are DETECTED. The SARS-CoV-2 RNA is generally detectable in upper and lower  respiratory specimens dur ing the acute phase of infection.  Positive  results are indicative of active infection with SARS-CoV-2.  Clinical  correlation with patient history and other diagnostic information is  necessary to determine patient infection status.  Positive results do  not rule out bacterial infection or co-infection with other viruses. If result is PRESUMPTIVE POSTIVE SARS-CoV-2 nucleic acids MAY BE PRESENT.   A presumptive positive result was obtained on the submitted specimen  and confirmed on repeat testing.  While 2019 novel coronavirus  (SARS-CoV-2) nucleic acids may be present in the submitted  sample  additional confirmatory testing may be necessary for epidemiological  and / or clinical management purposes  to differentiate between  SARS-CoV-2 and other Sarbecovirus currently known to infect humans.  If clinically indicated additional testing with an alternate test  methodology [phone]) is advised. The SARS-CoV-2 RNA is generally  detectable in upper and lower respiratory sp ecimens during the acute  phase  of infection. The expected result is Negative. Fact Sheet for Patients:  StrictlyIdeas.no Fact Sheet for Healthcare Providers: BankingDealers.co.za This test is not yet approved or cleared by the [place] FDA and has been authorized for detection and/or diagnosis of SARS-CoV-2 by FDA under an Emergency Use Authorization (EUA).  This EUA will remain in effect (meaning this test can be used) for the duration of the COVID-19 declaration under Section 564(b)(1) of the Act, 21 U.S.C. section 360bbb-3(b)(1), unless the authorization is terminated or revoked sooner. Performed at [facility], [place] [address], [place], [place] [address]    Dg Chest 1 View  Result Date: 03/27/2019 CLINICAL DATA:  Dizziness.  Hypertension. EXAM: CHEST  1 VIEW COMPARISON:  Chest x-ray dated 09/06/2016. FINDINGS: The lung volumes are low. There is an opacity at the left lung base favored to represent atelectasis or scarring. The cardiac silhouette is mildly enlarged. No pneumothorax. No large pleural effusion. No acute osseous abnormality. IMPRESSION: 1. Low lung volumes. 2. Probable atelectasis at the left lung base. Electronically Signed   By: Constance Holster M.D.   On: 03/27/2019 14:15   Ct Head Wo Contrast  Result Date: 03/27/2019 CLINICAL DATA:  Hypertension and blurry vision EXAM: CT HEAD WITHOUT CONTRAST TECHNIQUE: Contiguous axial images were obtained from the base of the skull through the vertex without intravenous contrast.  COMPARISON:  02/25/2019 FINDINGS: Brain: Chronic atrophic changes and white matter ischemic changes are seen. No findings to suggest acute hemorrhage, acute infarction or space-occupying mass lesion are seen. Vascular: No hyperdense vessel or unexpected calcification. Skull: Normal. Negative for fracture or focal lesion. Sinuses/Orbits: No acute finding. Other: None. IMPRESSION: Chronic atrophic and ischemic changes without acute abnormality. Electronically Signed   By: Inez Catalina M.D.   On: 03/27/2019 14:43    Pending Labs Unresulted Labs (From admission, onward)   None      Vitals/Pain Today's Vitals   03/27/19 1600 03/27/19 1615 03/27/19 1645 03/27/19 1700  BP: (!) 190/84 (!) 193/83 (!) 172/89 (!) 188/84  Pulse: 66 67 70 65  Resp: (!) 31 (!) 29 (!) 21 18  Temp:      TempSrc:      SpO2: 96% 96% 96% 96%  Weight:      Height:      PainSc:        Isolation Precautions No active isolations  Medications Medications  labetalol (NORMODYNE) injection 10 mg (10 mg Intravenous Given 03/27/19 1333)    Mobility walks High fall risk   Focused Assessments Neuro Assessment Handoff:  Swallow screen pass? Yes  Cardiac Rhythm: Normal sinus rhythm NIH Stroke Scale ( + Modified Stroke Scale Criteria)  Interval: Initial Level of Consciousness (1a.)   : Alert, keenly responsive LOC Questions (1b. )   +: Answers both questions correctly LOC Commands (1c. )   + : Performs both tasks correctly Best Gaze (2. )  +: Normal Visual (3. )  +: No visual loss Facial Palsy (4. )    : Normal symmetrical movements Motor Arm, Left (5a. )   +: No drift Motor Arm, Right (5b. )   +: No drift Motor Leg, Left (6a. )   +: No drift Motor Leg, Right (6b. )   +: No drift Limb Ataxia (7. ): Absent Sensory (8. )   +: Normal, no sensory loss Best Language (9. )   +: No aphasia Dysarthria (10. ): Normal Extinction/Inattention (11.)   +: No Abnormality Modified SS Total  +: 0  Complete NIHSS TOTAL: 0      Neuro Assessment: Within Defined Limits Neuro Checks:   Initial (03/27/19 1257)  Last Documented NIHSS Modified Score: 0 (03/27/19 1257) Has TPA been given? No If patient is a Neuro Trauma and patient is going to OR before floor call report to [facility] nurse: [phone] or [phone]     R Recommendations: See Admitting Provider Note  Report given to:   Additional Notes:

## 2019-03-27 NOTE — ED Provider Notes (Addendum)
[facility] EMERGENCY DEPARTMENT Provider Note   CSN: [id number] Arrival date & time: 03/27/19  1248    History   Chief Complaint Chief Complaint  Patient presents with   Hypertension   Blurred Vision   Dizziness    HPI Nishaan Stanke is a 85 y.o. male.     HPI Patient presents with blurred vision and dizziness.  Has had a frontal last 3 days.  States he feels a little unsteady.  States able to ambulate.  State feels as if there is some movement.  States his vision is also little blurry.  His blood pressure was 774 systolic at [facility].  States he does not like living there.  States he recently moved there.  States he has been compliant with his medications.  No other localizing numbness or weakness. Past Medical History:  Diagnosis Date   Anxiety    Hyperlipemia    Hypertension    Persistent dry cough     Patient Active Problem List   Diagnosis Date Noted   Gait abnormality 03/06/2019   Dementia ([place]) 09/26/2017   Paresthesia 03/24/2017   Mild cognitive impairment 11/03/2016   Anxiety 09/06/2016   Abnormal brain MRI 09/06/2016   Chronic cough 09/06/2016    Past Surgical History:  Procedure Laterality Date   KNEE SURGERY Right    TONSILLECTOMY          Home Medications    Prior to Admission medications   Medication Sig Start Date End Date Taking? Authorizing Provider  atorvastatin (LIPITOR) 40 MG tablet Take 40 mg by mouth every evening.     [provider]  donepezil (ARICEPT) 10 MG tablet Take 1 tablet (10 mg total) by mouth at bedtime. 03/30/18   Marcial Pacas, MD  meclizine (ANTIVERT) 12.5 MG tablet Take 12.5 mg by mouth 2 (two) times daily as needed for dizziness.     [provider]  memantine (NAMENDA) 10 MG tablet Take 1 tablet (10 mg total) by mouth 2 (two) times daily. 03/30/18   Marcial Pacas, MD  mirtazapine (REMERON) 15 MG tablet Take 15 mg by mouth at bedtime. 08/22/18   [provider]  oxyCODONE (ROXICODONE) 5 MG immediate release tablet For moderate pain, take one half tablet every 6 hours; for severe pain, take one full tablet every 6 hours 08/28/18   Duffy Bruce, MD  ranitidine (ZANTAC) 300 MG capsule Take 1 capsule (300 mg total) by mouth every evening. 08/08/18   Kozlow, Donnamarie Poag, MD  triamcinolone cream (KENALOG) 0.1 % Apply 1 application topically 2 (two) times daily.    [provider]  Trospium Chloride 60 MG CP24 Take 60 mg by mouth daily. 08/21/18   [provider]    Family History Family History  Problem Relation Age of Onset   Diabetes Mother    Appendicitis Father     Social History Social History   Tobacco Use   Smoking status: Former Smoker   Smokeless tobacco: Never Used  Substance Use Topics   Alcohol use: Yes    Comment: Occasional beer   Drug use: No     Allergies   Patient has no known allergies.   Review of Systems Review of Systems  Constitutional: Negative for appetite change.  Eyes: Positive for visual disturbance.  Respiratory: Negative for chest tightness and shortness of breath.   Cardiovascular: Negative for chest pain and leg swelling.  Gastrointestinal: Negative for abdominal pain.  Genitourinary: Negative for flank pain.  Musculoskeletal: Negative for back pain.  Neurological: Positive for dizziness. Negative for light-headedness.  Psychiatric/Behavioral: Negative for confusion.     Physical Exam Updated Vital Signs BP (!) 211/99    Pulse 72    Temp 98.6 F (37 C) (Oral)    Resp (!) 24    Ht 5\' 5"  (1.651 m)    Wt 62.1 kg    SpO2 97%    BMI 22.80 kg/m   Physical Exam Vitals signs and nursing note reviewed.  HENT:     Head: Normocephalic and atraumatic.  Eyes:     Extraocular Movements: Extraocular movements intact.     Pupils: Pupils are equal, round, and reactive to light.  Cardiovascular:     Rate and Rhythm: Normal rate.  Pulmonary:     Effort: Pulmonary effort is  normal.  Abdominal:     Tenderness: There is no abdominal tenderness.  Skin:    General: Skin is warm.  Neurological:     Mental Status: He is alert.     Comments: Awake and appropriate.  Slightly unsteady with standing but able to ambulate.  Finger-nose intact bilaterally.  Visual fields intact grossly by confrontation.  Mild nystagmus.  Psychiatric:        Mood and Affect: Mood normal.      ED Treatments / Results  Labs (all labs ordered are listed, but only abnormal results are displayed) Labs Reviewed  COMPREHENSIVE METABOLIC PANEL - Abnormal; Notable for the following components:      Result Value   Glucose, Bld 141 (*)    All other components within normal limits  CBC WITH DIFFERENTIAL/PLATELET - Abnormal; Notable for the following components:   Platelets 141 (*)    All other components within normal limits  TROPONIN I - Abnormal; Notable for the following components:   Troponin I 0.03 (*)    All other components within normal limits    EKG EKG Interpretation  Date/Time:  Tuesday Mar 27 2019 12:55:23 EDT Ventricular Rate:  74 PR Interval:    QRS Duration: 105 QT Interval:  449 QTC Calculation: 499 R Axis:   14 Text Interpretation:  Sinus rhythm Left atrial enlargement Abnormal inferior Q waves Borderline ST elevation, anterior leads Borderline prolonged QT interval Confirmed by Davonna Belling [phone]) on 03/27/2019 2:55:43 PM   Radiology Dg Chest 1 View  Result Date: 03/27/2019 CLINICAL DATA:  Dizziness.  Hypertension. EXAM: CHEST  1 VIEW COMPARISON:  Chest x-ray dated 09/06/2016. FINDINGS: The lung volumes are low. There is an opacity at the left lung base favored to represent atelectasis or scarring. The cardiac silhouette is mildly enlarged. No pneumothorax. No large pleural effusion. No acute osseous abnormality. IMPRESSION: 1. Low lung volumes. 2. Probable atelectasis at the left lung base. Electronically Signed   By: Constance Holster M.D.   On: 03/27/2019  14:15   Ct Head Wo Contrast  Result Date: 03/27/2019 CLINICAL DATA:  Hypertension and blurry vision EXAM: CT HEAD WITHOUT CONTRAST TECHNIQUE: Contiguous axial images were obtained from the base of the skull through the vertex without intravenous contrast. COMPARISON:  02/25/2019 FINDINGS: Brain: Chronic atrophic changes and white matter ischemic changes are seen. No findings to suggest acute hemorrhage, acute infarction or space-occupying mass lesion are seen. Vascular: No hyperdense vessel or unexpected calcification. Skull: Normal. Negative for fracture or focal lesion. Sinuses/Orbits: No acute finding. Other: None. IMPRESSION: Chronic atrophic and ischemic changes without acute abnormality. Electronically Signed   By: Inez Catalina M.D.   On: 03/27/2019  14:43    Procedures Procedures (including critical care time)  Medications Ordered in ED Medications  labetalol (NORMODYNE) injection 10 mg (10 mg Intravenous Given 03/27/19 1333)     Initial Impression / Assessment and Plan / ED Course  I have reviewed the triage vital signs and the nursing notes.  Pertinent labs & imaging results that were available during my care of the patient were reviewed by me and considered in my medical decision making (see chart for details).        Patient with hypertension.  Already on medicines.  However accompanied with the dizziness and vision changes feels patient benefit from admission to the hospital.  Troponin only minimally elevated.  No chest pain.  Head CT reassuring.  Will admit to unassigned medicine.  Final Clinical Impressions(s) / ED Diagnoses   Final diagnoses:  Hypertension, unspecified type    ED Discharge Orders    None       Davonna Belling, MD 03/27/19 1458    Davonna Belling, MD 06/03/19 [phone]

## 2019-03-27 NOTE — H&P (Addendum)
[facility] Admission History and Physical Service Pager: [phone]  Patient name: Andres Murray Medical record number: [id number] Date of birth: 08/21/1934 Age: 85 y.o. Gender: male  Primary Care Provider: [name], MD Consultants: Neurology Code Status: Partial Code  Preferred Emergency Contact : Daughter, Lucia Estelle" [id number] [phone]    Chief Complaint: Hypertension; Blurred Vision; and Dizziness   Assessment and Plan: Andres Murray is a 85 y.o. male who presented with blurred vision and dizziness and was admitted for hypertensive emergency with BP 214/97.  PMHx s/f gait abnormality, dementia, mild cognitive impairment, anxiety, chronic cough, anxiety, HLD, HTN, history of abnormal brain MRI in 2017, mild  #Hypertensive emergency, elevated troponins, headache/dizziness Patient with history of hypertension previously on amlodipine losartan and previous history of high blood pressures currently now only on Toprol-XL 25 mg daily.  No history of previous CVA or MI.  He presents today with blood pressures elevated over the last few days and greater than 200/100 at his assisted living facility, [facility].  Most likely cause of hypertension is need for more medication.  Since assisted living facility provides medications daily, it is likely that patient has been taking his metoprolol.  Can also consider secondary causes of hypertension if patient does not respond to addition and titration of initial agents.  Can also consider other primary causes and will obtain UDS, TSH, as well as BNP.  Can also consider increased stress that patient reports experiencing as he is at a new assisted living center that he does not particularly like.  He reports that he has not been getting much sleep as his neighbors are very loud.  Overnight, will attempt to decrease patient's blood pressure slowly. Patient is stable at this time, heart rate is in the 60s.  He does not have  any shortness of breath, chest pain and no acute neurologic abnormalities.  His initial troponin was mildly elevated at 0.03.  CMP and CBC were unremarkable except for glucose 140 and platelets 148.  His EKG is significant for some borderline ST elevations, however, I do not see elevations on my read.  Will repeat EKG in the morning and follow troponins overnight.  If any concern for ischemic disease, will consult cardiology for further ischemic work-up.  Additionally, if any acute changes in mental status or neuro exam, will obtain MRI without and consult neurology. . Admit to FMTS, attending Dr. Milta Deiters. Level of care: telemetry and with continuous cardiac monitoring  . Continuous cardiac monitoring  . Vitals per unit routine, Activity OOB w/ assistance  . AM Labs  . F/U TSH, BNP, UDS  . F/U Echo . Repeat EKG in AM . If trops positive, C/S cards for ischemic work up . If changes in mental status, Obtain MRI, C/S Neuro  . Continue metoprolol 25mg  daily  . Start amodipine 10mg , nightly, may consider also starting ACE or ARB therapy as patient has previously been on and tolerated well . Prn labetalol sys >220/ dia >110 . Will also obtain other risk ratification labs, lipid panel, A1c  #Dementia #Mild cognitive impairment Followed by Dr. Evelena Leyden at [facility].  Patient is alert and oriented x4 during exam.  He speaks clearly and fluently.  He does not have issue recalling events or history. At home, patient states Namenda 10 mg twice daily, donezepil 10 mg at bedtime  Continue home Namenda and donezepil  Delirium precautions  #Gait abnormality At home, patient can walk by himself but does use walker or  cane if needed.   PT   Out of bed with assistance  #HLD No recent lipid panel.  On atorvastatin 40 mg every evening at home.  Continue atorvastatin 40 mg  Follow-up lipid panel  #Mild hyperglycemia Glucose on admission is 140.  No history of diabetes.    Follow-up  A1c  #History of prostate cancer #Urinary retention, chronic Condom catheter was placed on admission.  Patient did report some pain just under her umbilicus with palpation.  He reports that he has this pain from history of bladder retention.  Prostate cancer was treated with radioactive seeds and is currently in remission per recent urology follow-up earlier this year.  UA was negative on admission.  Discontinue condom catheter  Place urinal next to bed  If no urine output or patient every shift or if patient appears uncomfortable, please bladder scan.  Call MD if in and out cath needs to be performed.  continue home flomax 0.4mg   #FEN/GI:  . Nutrition: Heart healthy  Access: L AC PIV VTE prophylaxis: Lovenox 40 (CrCl>30)  Disposition: Admit to Tele.  ============================================================================= HPI Andres Murray is a 85 y.o. male with past medical history significant for hypertension, history of prostate cancer status post radioactive treatment, hyperlipidemia, who presents with dizziness and lightheadedness.  Patient reports that he has been having high blood pressures over the last couple of days.  This morning, he felt dizzy and lightheaded with a headache "all over".  This morning, he reported to the nursing station because he felt that his blood pressure was high and patient reports blood pressure was 214, but is not 100% sure.  Patient arrived from [facility], and assisted living facility.  He reports frustration with [name] as they have "taken over" all of his medications.  He believes that he is on a high blood pressure medication called atorvastatin.  I explained to him that atorvastatin was not a blood pressure medication and it appeared he was on blood pressure medications in the past, but does not have any on his medication list now.  He reports going to Dr. Einar Grad at [facility] and sees her 2-3 times a year.  He has an appointment with her  on 04/04/2019.  In the ED, patient arrived with blood pressures greater than >200/100.  CT head was negative for any acute findings.  Chest x-ray with some mild atelectasis.  Labs were otherwise notable for thrombocytopenia and blood glucose of 141.  Troponin was mildly elevated at 0.03.  CBC was otherwise within normal limits.  Patient had 100 protein in his urine, 20 ketones and coronavirus was negative. Abnormal Labs Reviewed  COMPREHENSIVE METABOLIC PANEL - Abnormal; Notable for the following components:      Result Value   Glucose, Bld 141 (*)    All other components within normal limits  CBC WITH DIFFERENTIAL/PLATELET - Abnormal; Notable for the following components:   Platelets 141 (*)    All other components within normal limits  TROPONIN I - Abnormal; Notable for the following components:   Troponin I 0.03 (*)    All other components within normal limits    Review Of Systems: Review of Systems  Constitutional: Negative for chills, fever and malaise/fatigue.  HENT: Negative for congestion, ear pain, hearing loss, sinus pain and sore throat.   Eyes: Negative for blurred vision and double vision.  Respiratory: Negative for cough, shortness of breath and wheezing.   Cardiovascular: Negative for chest pain, palpitations and leg swelling.  Gastrointestinal: Negative for nausea  and vomiting.       Some periumbilical pain due to bladder retention  Genitourinary: Negative for dysuria and flank pain.  Musculoskeletal: Positive for falls. Negative for myalgias.  Skin: Negative for itching and rash.  Neurological: Positive for dizziness, focal weakness, weakness and headaches. Negative for speech change and loss of consciousness.  Endo/Heme/Allergies: Bruises/bleeds easily.  Psychiatric/Behavioral: Negative for depression and memory loss.   Patient Active Problem List   Diagnosis Date Noted  . Hypertensive emergency without congestive heart failure 03/27/2019  . Hypertensive urgency  03/27/2019  . Hypertension 03/27/2019  . Gait abnormality 03/06/2019  . Dementia ([place]) 09/26/2017  . Paresthesia 03/24/2017  . Mild cognitive impairment 11/03/2016  . Anxiety 09/06/2016  . Abnormal brain MRI 09/06/2016  . Chronic cough 09/06/2016   Past Medical History: Past Medical History:  Diagnosis Date  . Anxiety   . Hyperlipemia   . Hypertension   . Persistent dry cough     Past Surgical History: Past Surgical History:  Procedure Laterality Date  . KNEE SURGERY Right   . TONSILLECTOMY      Family History: family history includes Appendicitis in his father; Diabetes in his mother.   Social History: Social History   Social History Narrative   03/27/19: patient currently lives at [facility] assisted [facility].  Previously, lives at [facility] which was in independent living.  His daughter placed him at [facility] as she thought it would improve his medication compliance.  Patient's wife passed away 3 years ago.  Receives a lot of support from his kids, 2 and 35 years old, both live here in [place].      Lives at home with wife, [name].   Right-handed.   4-5 cups caffeine daily.    Wilferd reports that he has quit smoking. He has never used smokeless tobacco. He reports current alcohol use. He reports that he does not use drugs.  Allergies and Medications: No Known Allergies Current Meds  Medication Sig  . atorvastatin (LIPITOR) 20 MG tablet Take 20 mg by mouth at bedtime.  . donepezil (ARICEPT) 10 MG tablet Take 1 tablet (10 mg total) by mouth at bedtime. (Patient taking differently: Take 10 mg by mouth daily. )  . Melatonin 10 MG TABS Take 20 mg by mouth at bedtime.  . memantine (NAMENDA) 10 MG tablet Take 1 tablet (10 mg total) by mouth 2 (two) times daily.  . metoprolol succinate (TOPROL-XL) 25 MG 24 hr tablet Take 25 mg by mouth daily.  . naproxen sodium (ALEVE) 220 MG tablet Take 220 mg by mouth every 8 (eight) hours as needed (for fever or pain).  [name] omeprazole  (PRILOSEC OTC) 20 MG tablet Take 20 mg by mouth daily before breakfast.  . tamsulosin (FLOMAX) 0.4 MG CAPS capsule Take 0.4 mg by mouth daily.  [name] triamcinolone cream (KENALOG) 0.1 % Apply 1 application topically daily as needed (for skin condition).     Objective: BP (!) 194/93 (BP Location: Right Arm)   Pulse 67   Temp 97.6 F (36.4 C) (Oral)   Resp (!) 37   Ht 5\' 5"  (1.651 m)   Wt 62.1 kg   SpO2 98%   BMI 22.80 kg/m   Exam: Physical Exam Constitutional:      General: He is not in acute distress.    Appearance: Normal appearance. He is normal weight. He is not ill-appearing, toxic-appearing or diaphoretic.  HENT:     Head: Normocephalic and atraumatic.     Nose: Nose normal. No  congestion or rhinorrhea.     Mouth/Throat:     Mouth: Mucous membranes are moist.     Pharynx: No oropharyngeal exudate or posterior oropharyngeal erythema.  Eyes:     Extraocular Movements: Extraocular movements intact.     Conjunctiva/sclera: Conjunctivae normal.     Pupils: Pupils are equal, round, and reactive to light.  Neck:     Musculoskeletal: Normal range of motion and neck supple. No neck rigidity or muscular tenderness.  Cardiovascular:     Rate and Rhythm: Normal rate and regular rhythm.     Pulses: Normal pulses.     Heart sounds: Normal heart sounds. No murmur.  Pulmonary:     Effort: Pulmonary effort is normal.     Breath sounds: Normal breath sounds.  Abdominal:     General: Abdomen is flat. Bowel sounds are normal.     Palpations: Abdomen is soft.     Tenderness: There is no right CVA tenderness or left CVA tenderness.     Comments: Tenderness to palpation under umbilicus  Musculoskeletal:        General: No deformity or signs of injury.     Right lower leg: No edema.     Left lower leg: No edema.  Lymphadenopathy:     Cervical: No cervical adenopathy.  Skin:    General: Skin is warm and dry.     Capillary Refill: Capillary refill takes less than 2 seconds.      Findings: Bruising present.     Comments: On chest, small 2 cm in diameter circular, areas of purpura  Neurological:     General: No focal deficit present.     Mental Status: He is alert and oriented to person, place, and time.     Cranial Nerves: No cranial nerve deficit.     Sensory: No sensory deficit.     Motor: No weakness.     Coordination: Coordination normal.  Psychiatric:        Mood and Affect: Mood normal.        Behavior: Behavior normal.        Thought Content: Thought content normal.        Judgment: Judgment normal.      Labs and Imaging: I have personally reviewed following labs and imaging studies CBC: Recent Labs  Lab 03/27/19 1332  WBC 6.9  NEUTROABS 5.6  HGB 13.8  HCT 41.9  MCV 93.9  PLT 141*   CMP: Recent Labs  Lab 03/27/19 1332  NA 141  K 3.5  CL 105  CO2 26  GLUCOSE 141*  BUN 16  CREATININE 1.00  CALCIUM 9.4  ALBUMIN 3.6     GFR: Estimated Creatinine Clearance: 49.5 mL/min (by C-G formula based on SCr of 1 mg/dL).  Liver Function Tests: Recent Labs  Lab 03/27/19 1332  AST 21  ALT 18  ALKPHOS 53  BILITOT 0.7  PROT 6.5  ALBUMIN 3.6   Cardiac Enzymes: Recent Labs  Lab 03/27/19 1332  TROPONINI 0.03*   Imaging/Diagnostic Tests: Dg Chest 1 View Result Date: 03/27/2019 IMPRESSION: 1. Low lung volumes. 2. Probable atelectasis at the left lung base.   Ct Head Wo Contrast Result Date: 03/27/2019 IMPRESSION: Chronic atrophic and ischemic changes without acute abnormality.    EKG Interpretation  Date/Time:  Tuesday Mar 27 2019 12:55:23 EDT Ventricular Rate:  74 PR Interval:    QRS Duration: 105 QT Interval:  449 QTC Calculation: 499 R Axis:   14 Text Interpretation:  Sinus rhythm Left  atrial enlargement Abnormal inferior Q waves Borderline ST elevation, anterior leads Borderline prolonged QT interval Confirmed by Davonna Belling [phone]) on 03/27/2019 2:55:43 PM          Wilber Oliphant, M.D. 03/27/2019, 8:03 PM PGY-1,  [facility] pager: [phone], text pages welcome    FPTS Upper-Level Resident Addendum   I have independently interviewed and examined the patient. I have discussed the above with the original author and agree with their documentation. My edits for correction/addition/clarification are in purple. Please see also any attending notes.    Martinique Willey Due, DO PGY-2, [facility] 03/27/2019 9:43 PM  [place] Service pager: [phone] (text pages welcome through [facility])

## 2019-03-27 NOTE — ED Notes (Signed)
Patient transported to CT 

## 2019-03-27 NOTE — ED Triage Notes (Signed)
Pt here from [facility] assisted living for blurred vision and dizziness x 3 days. Bp 527 systolic at facility this morning. Compliant with metoprolol.

## 2019-03-27 NOTE — ED Provider Notes (Signed)
3:26 PM Care assumed from Dr. Alvino Chapel while awaiting for admission.  Patient has hypertensive emergency and has dizziness and some vision changes.  Patient has been given labetalol.  Patient was also found to have elevated troponin.  He was not having chest pain or shortness of breath by report.  Patient will be admitted for further management.  Patient admitted for further management of hypertensive emergency.  Clinical Impression: 1. Hypertension, unspecified type     Disposition: Admit  This note was prepared with assistance of Dragon voice recognition software. Occasional wrong-word or sound-a-like substitutions may have occurred due to the inherent limitations of voice recognition software.     Tegeler, Gwenyth Allegra, MD 03/28/19 [phone]

## 2019-03-28 ENCOUNTER — Observation Stay (HOSPITAL_COMMUNITY): Payer: Medicare Other

## 2019-03-28 DIAGNOSIS — Z87891 Personal history of nicotine dependence: Secondary | ICD-10-CM | POA: Diagnosis not present

## 2019-03-28 DIAGNOSIS — R269 Unspecified abnormalities of gait and mobility: Secondary | ICD-10-CM | POA: Diagnosis present

## 2019-03-28 DIAGNOSIS — Z79899 Other long term (current) drug therapy: Secondary | ICD-10-CM | POA: Diagnosis not present

## 2019-03-28 DIAGNOSIS — R42 Dizziness and giddiness: Secondary | ICD-10-CM | POA: Diagnosis present

## 2019-03-28 DIAGNOSIS — I161 Hypertensive emergency: Secondary | ICD-10-CM | POA: Diagnosis present

## 2019-03-28 DIAGNOSIS — G3184 Mild cognitive impairment, so stated: Secondary | ICD-10-CM | POA: Diagnosis not present

## 2019-03-28 DIAGNOSIS — R339 Retention of urine, unspecified: Secondary | ICD-10-CM | POA: Diagnosis present

## 2019-03-28 DIAGNOSIS — R9431 Abnormal electrocardiogram [ECG] [EKG]: Secondary | ICD-10-CM | POA: Diagnosis not present

## 2019-03-28 DIAGNOSIS — I10 Essential (primary) hypertension: Secondary | ICD-10-CM | POA: Diagnosis present

## 2019-03-28 DIAGNOSIS — E785 Hyperlipidemia, unspecified: Secondary | ICD-10-CM | POA: Diagnosis present

## 2019-03-28 DIAGNOSIS — R739 Hyperglycemia, unspecified: Secondary | ICD-10-CM | POA: Diagnosis present

## 2019-03-28 DIAGNOSIS — H538 Other visual disturbances: Secondary | ICD-10-CM | POA: Diagnosis present

## 2019-03-28 DIAGNOSIS — R7303 Prediabetes: Secondary | ICD-10-CM | POA: Diagnosis present

## 2019-03-28 DIAGNOSIS — Z8546 Personal history of malignant neoplasm of prostate: Secondary | ICD-10-CM | POA: Diagnosis not present

## 2019-03-28 DIAGNOSIS — R202 Paresthesia of skin: Secondary | ICD-10-CM

## 2019-03-28 DIAGNOSIS — Z1159 Encounter for screening for other viral diseases: Secondary | ICD-10-CM | POA: Diagnosis not present

## 2019-03-28 DIAGNOSIS — F0390 Unspecified dementia without behavioral disturbance: Secondary | ICD-10-CM | POA: Diagnosis present

## 2019-03-28 DIAGNOSIS — Z79891 Long term (current) use of opiate analgesic: Secondary | ICD-10-CM | POA: Diagnosis not present

## 2019-03-28 DIAGNOSIS — F419 Anxiety disorder, unspecified: Secondary | ICD-10-CM

## 2019-03-28 LAB — HEMOGLOBIN A1C
Hgb A1c MFr Bld: 5.7 % — ABNORMAL HIGH (ref 4.8–5.6)
Mean Plasma Glucose: 116.89 mg/dL

## 2019-03-28 LAB — CBC
HCT: 42.9 % (ref 39.0–52.0)
Hemoglobin: 14.2 g/dL (ref 13.0–17.0)
MCH: 30.6 pg (ref 26.0–34.0)
MCHC: 33.1 g/dL (ref 30.0–36.0)
MCV: 92.5 fL (ref 80.0–100.0)
Platelets: 149 10*3/uL — ABNORMAL LOW (ref 150–400)
RBC: 4.64 MIL/uL (ref 4.22–5.81)
RDW: 13.3 % (ref 11.5–15.5)
WBC: 6.4 10*3/uL (ref 4.0–10.5)
nRBC: 0 % (ref 0.0–0.2)

## 2019-03-28 LAB — BASIC METABOLIC PANEL
Anion gap: 8 (ref 5–15)
BUN: 19 mg/dL (ref 8–23)
CO2: 28 mmol/L (ref 22–32)
Calcium: 9.5 mg/dL (ref 8.9–10.3)
Chloride: 104 mmol/L (ref 98–111)
Creatinine, Ser: 1.03 mg/dL (ref 0.61–1.24)
GFR calc Af Amer: >60 mL/min (ref >60)
GFR calc non Af Amer: >60 mL/min (ref >60)
Glucose, Bld: 122 mg/dL — ABNORMAL HIGH (ref 70–99)
Potassium: 3.2 mmol/L — ABNORMAL LOW (ref 3.5–5.1)
Sodium: 140 mmol/L (ref 135–145)

## 2019-03-28 LAB — LIPID PANEL
Cholesterol: 113 mg/dL (ref 0–200)
HDL: 41 mg/dL (ref >40)
LDL Cholesterol: 60 mg/dL (ref 0–99)
Total CHOL/HDL Ratio: 2.8 RATIO
Triglycerides: 58 mg/dL (ref <150)
VLDL: 12 mg/dL (ref 0–40)

## 2019-03-28 LAB — TROPONIN I
Troponin I: 0.03 ng/mL (ref <0.03)
Troponin I: 0.03 ng/mL (ref <0.03)

## 2019-03-28 LAB — ECHOCARDIOGRAM LIMITED
Height: 65 in
Weight: 2192 oz

## 2019-03-28 MED ORDER — METOPROLOL SUCCINATE ER 25 MG PO TB24
12.5000 mg | ORAL_TABLET | Freq: Every day | ORAL | Status: DC
  Administered 2019-03-28: 12.5 mg via ORAL
  Filled 2019-03-28: qty 1

## 2019-03-28 MED ORDER — AMLODIPINE BESYLATE 10 MG PO TABS: 10.0000 mg | ORAL_TABLET | Freq: Every day | ORAL | Status: DC

## 2019-03-28 MED ORDER — LOSARTAN POTASSIUM 50 MG PO TABS: 50.0000 mg | ORAL_TABLET | Freq: Every day | ORAL | 0 refills | Status: DC

## 2019-03-28 MED ORDER — METOPROLOL SUCCINATE ER 25 MG PO TB24: 25.0000 mg | ORAL_TABLET | Freq: Every day | ORAL | Status: DC

## 2019-03-28 MED ORDER — AMLODIPINE BESYLATE 10 MG PO TABS: 10.0000 mg | ORAL_TABLET | Freq: Every day | ORAL | 0 refills | Status: DC

## 2019-03-28 MED ORDER — LOSARTAN POTASSIUM 50 MG PO TABS: 50.0000 mg | ORAL_TABLET | Freq: Every day | ORAL | Status: DC

## 2019-03-28 MED ORDER — LOSARTAN POTASSIUM 50 MG PO TABS
50.0000 mg | ORAL_TABLET | Freq: Every day | ORAL | Status: DC
  Filled 2019-03-28: qty 1

## 2019-03-28 NOTE — TOC Transition Note (Signed)
Transition of Care [facility]) - CM/SW Discharge Note   Patient Details  Name: Andres Murray MRN: [id number] Date of Birth: 09-06-1934  [facility] CM/SW Contact:  Vinie Sill, [place] Phone Number: 03/28/2019, 5:14 PM   Clinical Narrative:     [facility] will [facility] to: [name] DC Date: 03/28/2019 Family Notified: [name], daughter  Transport By: Daughter, [name], patient, and facility notified of DC. Discharge Summary sent to facility. RN given number for [phone].   Clinical Social Worker signing off. Thurmond Butts, MSW, [facility] Clinical Social Worker [phone]    Final next level of care: Assisted Living Barriers to Discharge: No Barriers Identified   Patient Goals and CMS Choice Patient states their goals for this hospitalization and ongoing recovery are:: return to ALF      Discharge Placement              Patient chooses bed at: [facility]) Patient to be transferred to facility by: daughter, [name] Name of family member notified: [name], daughter Patient and family notified of of transfer: 03/28/19  Discharge Plan and Services                                     Social Determinants of Health (SDOH) Interventions     Readmission Risk Interventions No flowsheet data found.

## 2019-03-28 NOTE — Progress Notes (Signed)
Patient discharged, waiting for Daughter to pick him up.  Discharge instructions reviewed, [employer] called and notified

## 2019-03-28 NOTE — Evaluation (Signed)
Physical Therapy Evaluation Patient Details Name: Andres Murray MRN: [id number] DOB: Nov 18, 1933 Today's Date: 03/28/2019   History of Present Illness  Andres Murray is a 85 y.o. male who presented with blurred vision and dizziness and was admitted for hypertensive emergency with BP 214/97.   PMHx s/f gait abnormality, dementia, mild cognitive impairment, anxiety, chronic cough, anxiety, HLD, HTN, history of abnormal brain MRI in 2017.  Clinical Impression  Pt is close to baseline functioning and should be safe at the ALF. There are no further acute PT needs.  Will sign off at this time.     Follow Up Recommendations No PT follow up;Supervision - Intermittent    Equipment Recommendations  None recommended by PT    Recommendations for Other Services       Precautions / Restrictions Precautions Precautions: None      Mobility  Bed Mobility Overal bed mobility: Independent                Transfers Overall transfer level: Needs assistance   Transfers: Sit to/from Stand Sit to Stand: Supervision         General transfer comment: several bounces to get up at times and then sit to stand with ease at other times.  Ambulation/Gait Ambulation/Gait assistance: Supervision;Independent(Independent in a home-like environment without lines) Gait Distance (Feet): 300 Feet Assistive device: None Gait Pattern/deviations: Step-through pattern   Gait velocity interpretation: >2.62 ft/sec, indicative of community ambulatory General Gait Details: short flat-footed steps, prefers slower cadence, but able to speed up significantly to cuing..  pt scanned and turned abruptly without deveiation.  Stairs            Wheelchair Mobility    Modified Rankin (Stroke Patients Only)       Balance Overall balance assessment: Needs assistance   Sitting balance-Leahy Scale: Normal       Standing balance-Leahy Scale: Good                               Pertinent  Vitals/Pain Pain Assessment: No/denies pain    Home Living Family/patient expects to be discharged to:: Assisted living               Home Equipment: Walker - 2 wheels;Cane - single point;Grab bars - toilet;Grab bars - tub/shower      Prior Function Level of Independence: Independent;Independent with assistive device(s)         Comments: Staff handle his meds     Hand Dominance        Extremity/Trunk Assessment   Upper Extremity Assessment Upper Extremity Assessment: Overall WFL for tasks assessed    Lower Extremity Assessment Lower Extremity Assessment: Overall WFL for tasks assessed(mild weakness from inactivity)       Communication   Communication: No difficulties  Cognition Arousal/Alertness: Awake/alert Behavior During Therapy: WFL for tasks assessed/performed Overall Cognitive Status: History of cognitive impairments - at baseline                                 General Comments: functional for the evaluation      General Comments General comments (skin integrity, edema, etc.): pt had to change into mesh pants and maxi's x2 due to urinary incontinence.    Exercises     Assessment/Plan    PT Assessment Patient needs continued PT services  PT Problem List Decreased mobility;Decreased activity tolerance;Decreased strength  PT Treatment Interventions      PT Goals (Current goals can be found in the Care Plan section)  Acute Rehab PT Goals Patient Stated Goal: get out of the hospital PT Goal Formulation: All assessment and education complete, DC therapy    Frequency     Barriers to discharge        Co-evaluation               AM-PAC PT "6 Clicks" Mobility  Outcome Measure Help needed turning from your back to your side while in a flat bed without using bedrails?: None Help needed moving from lying on your back to sitting on the side of a flat bed without using bedrails?: None Help needed moving to and from a bed  to a chair (including a wheelchair)?: None Help needed standing up from a chair using your arms (e.g., wheelchair or bedside chair)?: None Help needed to walk in hospital room?: None Help needed climbing 3-5 steps with a railing? : None 6 Click Score: 24    End of Session   Activity Tolerance: Patient tolerated treatment well Patient left: in bed;with call bell/phone within reach Nurse Communication: Mobility status PT Visit Diagnosis: Unsteadiness on feet (R26.81)    Time: 1334-1401 PT Time Calculation (min) (ACUTE ONLY): 27 min   Charges:   PT Evaluation $PT Eval Low Complexity: 1 Low PT Treatments $Gait Training: 8-22 mins        03/28/2019  Donnella Sham, PT Acute [place] [phone]  (pager) [phone]  (office)  Andres Murray 03/28/2019, 2:31 PM

## 2019-03-28 NOTE — Discharge Instructions (Signed)
Dear Andres Murray,   Thank you for letting us participate in your care! In this section, you will find a brief hospital admission summary of why you were admitted to the hospital, what happened during your admission, your diagnosis/diagnoses, and recommended follow up.   You were admitted because you were experiencing high blood pressure associated with dizziness, lightheadedness, blurred vision.  Your initial imaging and lab work was reassuring.  You did not suffer from a stroke, nor did you have a heart attack.  Your blood pressure medications were adjusted and should continue to be adjusted by your primary care provider after your discharge.  Your goal blood pressure is less than 140/90.  Please continue to take the losartan and the amlodipine.  Hopefully, you can be tapered off of the metoprolol.  Please let your doctor know about this hospital visit and bring all paperwork with you to your next appointment on 04/04/2019.  She should receive a copy of the hospital stay via fax.  Thank you for choosing [facility]! Take care and be well!  [facility]  [place], [place] [address] [phone]     DASH Eating Plan DASH stands for "Dietary Approaches to Stop Hypertension." The DASH eating plan is a healthy eating plan that has been shown to reduce high blood pressure (hypertension). It may also reduce your risk for type 2 diabetes, heart disease, and stroke. The DASH eating plan may also help with weight loss. What are tips for following this plan?  General guidelines  Avoid eating more than 2,300 mg (milligrams) of salt (sodium) a day. If you have hypertension, you may need to reduce your sodium intake to 1,500 mg a day.  Limit alcohol intake to no more than 1 drink a day for nonpregnant women and 2 drinks a day for men. One drink equals 12 oz of beer, 5 oz of wine, or 1 oz of hard  liquor.  Work with your health care provider to maintain a healthy body weight or to lose weight. Ask what an ideal weight is for you.  Get at least 30 minutes of exercise that causes your heart to beat faster (aerobic exercise) most days of the week. Activities may include walking, swimming, or biking.  Work with your health care provider or diet and nutrition specialist (dietitian) to adjust your eating plan to your individual calorie needs. Reading food labels   Check food labels for the amount of sodium per serving. Choose foods with less than 5 percent of the Daily Value of sodium. Generally, foods with less than 300 mg of sodium per serving fit into this eating plan.  To find whole grains, look for the word "whole" as the first word in the ingredient list. Shopping  Buy products labeled as "low-sodium" or "no salt added."  Buy fresh foods. Avoid canned foods and premade or frozen meals. Cooking  Avoid adding salt when cooking. Use salt-free seasonings or herbs instead of table salt or sea salt. Check with your health care provider or pharmacist before using salt substitutes.  Do not fry foods. Cook foods using healthy methods such as baking, boiling, grilling, and broiling instead.  Cook with heart-healthy oils, such as olive, canola, soybean, or sunflower oil. Meal planning  Eat a balanced diet that includes: ? 5 or more servings of fruits and vegetables each day. At each meal, try to fill half of your plate with  fruits and vegetables. ? Up to 6-8 servings of whole grains each day. ? Less than 6 oz of lean meat, poultry, or fish each day. A 3-oz serving of meat is about the same size as a deck of cards. One egg equals 1 oz. ? 2 servings of low-fat dairy each day. ? A serving of nuts, seeds, or beans 5 times each week. ? Heart-healthy fats. Healthy fats called Omega-3 fatty acids are found in foods such as flaxseeds and coldwater fish, like sardines, salmon, and  mackerel.  Limit how much you eat of the following: ? Canned or prepackaged foods. ? Food that is high in trans fat, such as fried foods. ? Food that is high in saturated fat, such as fatty meat. ? Sweets, desserts, sugary drinks, and other foods with added sugar. ? Full-fat dairy products.  Do not salt foods before eating.  Try to eat at least 2 vegetarian meals each week.  Eat more home-cooked food and less restaurant, buffet, and fast food.  When eating at a restaurant, ask that your food be prepared with less salt or no salt, if possible. What foods are recommended? The items listed may not be a complete list. Talk with your dietitian about what dietary choices are best for you. Grains Whole-grain or whole-wheat bread. Whole-grain or whole-wheat pasta. Brown rice. [place]. Bulgur. Whole-grain and low-sodium cereals. Pita bread. Low-fat, low-sodium crackers. Whole-wheat flour tortillas. Vegetables Fresh or frozen vegetables (raw, steamed, roasted, or grilled). Low-sodium or reduced-sodium tomato and vegetable juice. Low-sodium or reduced-sodium tomato sauce and tomato paste. Low-sodium or reduced-sodium canned vegetables. Fruits All fresh, dried, or frozen fruit. Canned fruit in natural juice (without added sugar). Meat and other protein foods Skinless chicken or [place]. Ground chicken or [place]. Pork with fat trimmed off. Fish and seafood. Egg whites. Dried beans, peas, or lentils. Unsalted nuts, nut butters, and seeds. Unsalted canned beans. Lean cuts of beef with fat trimmed off. Low-sodium, lean deli meat. Dairy Low-fat (1%) or fat-free (skim) milk. Fat-free, low-fat, or reduced-fat cheeses. Nonfat, low-sodium ricotta or cottage cheese. Low-fat or nonfat yogurt. Low-fat, low-sodium cheese. Fats and oils Soft margarine without trans fats. Vegetable oil. Low-fat, reduced-fat, or light mayonnaise and salad dressings (reduced-sodium). Canola, safflower, olive, soybean, and  sunflower oils. Avocado. Seasoning and other foods Herbs. Spices. Seasoning mixes without salt. Unsalted popcorn and pretzels. Fat-free sweets. What foods are not recommended? The items listed may not be a complete list. Talk with your dietitian about what dietary choices are best for you. Grains Baked goods made with fat, such as croissants, muffins, or some breads. Dry pasta or rice meal packs. Vegetables Creamed or fried vegetables. Vegetables in a cheese sauce. Regular canned vegetables (not low-sodium or reduced-sodium). Regular canned tomato sauce and paste (not low-sodium or reduced-sodium). Regular tomato and vegetable juice (not low-sodium or reduced-sodium). Angie Fava. Olives. Fruits Canned fruit in a light or heavy syrup. Fried fruit. Fruit in cream or butter sauce. Meat and other protein foods Fatty cuts of meat. Ribs. Fried meat. Berniece Salines. Sausage. Bologna and other processed lunch meats. Salami. Fatback. Hotdogs. Bratwurst. Salted nuts and seeds. Canned beans with added salt. Canned or smoked fish. Whole eggs or egg yolks. Chicken or [place] with skin. Dairy Whole or 2% milk, cream, and half-and-half. Whole or full-fat cream cheese. Whole-fat or sweetened yogurt. Full-fat cheese. Nondairy creamers. Whipped toppings. Processed cheese and cheese spreads. Fats and oils Butter. Stick margarine. Lard. Shortening. Ghee. Bacon fat. Tropical oils, such as coconut, palm  kernel, or palm oil. Seasoning and other foods Salted popcorn and pretzels. Onion salt, garlic salt, seasoned salt, table salt, and sea salt. Worcestershire sauce. Tartar sauce. Barbecue sauce. Teriyaki sauce. Soy sauce, including reduced-sodium. Steak sauce. Canned and packaged gravies. Fish sauce. Oyster sauce. Cocktail sauce. Horseradish that you find on the shelf. Ketchup. Mustard. Meat flavorings and tenderizers. Bouillon cubes. Hot sauce and Tabasco sauce. Premade or packaged marinades. Premade or packaged taco seasonings.  Relishes. Regular salad dressings. Where to find more information:  National Heart, Lung, and [place]: https://wilson-eaton.com/  American Heart Association: www.heart.org Summary  The DASH eating plan is a healthy eating plan that has been shown to reduce high blood pressure (hypertension). It may also reduce your risk for type 2 diabetes, heart disease, and stroke.  With the DASH eating plan, you should limit salt (sodium) intake to 2,300 mg a day. If you have hypertension, you may need to reduce your sodium intake to 1,500 mg a day.  When on the DASH eating plan, aim to eat more fresh fruits and vegetables, whole grains, lean proteins, low-fat dairy, and heart-healthy fats.  Work with your health care provider or diet and nutrition specialist (dietitian) to adjust your eating plan to your individual calorie needs. This information is not intended to replace advice given to you by your health care provider. Make sure you discuss any questions you have with your health care provider. Document Released: 10/21/2011 Document Revised: 10/25/2016 Document Reviewed: 10/25/2016 Elsevier Interactive Patient Education  2019 [employer].   Hypertension Hypertension, commonly called high blood pressure, is when the force of blood pumping through the arteries is too strong. The arteries are the blood vessels that carry blood from the heart throughout the body. Hypertension forces the heart to work harder to pump blood and may cause arteries to become narrow or stiff. Having untreated or uncontrolled hypertension can cause heart attacks, strokes, kidney disease, and other problems. A blood pressure reading consists of a higher number over a lower number. Ideally, your blood pressure should be below 120/80. The first ("top") number is called the systolic pressure. It is a measure of the pressure in your arteries as your heart beats. The second ("bottom") number is called the diastolic pressure. It is a  measure of the pressure in your arteries as the heart relaxes. What are the causes? The cause of this condition is not known. What increases the risk? Some risk factors for high blood pressure are under your control. Others are not. Factors you can change  Smoking.  Having type 2 diabetes mellitus, high cholesterol, or both.  Not getting enough exercise or physical activity.  Being overweight.  Having too much fat, sugar, calories, or salt (sodium) in your diet.  Drinking too much alcohol. Factors that are difficult or impossible to change  Having chronic kidney disease.  Having a family history of high blood pressure.  Age. Risk increases with age.  Race. You may be at higher risk if you are African-American.  Gender. Men are at higher risk than women before age 30. After age 75, women are at higher risk than men.  Having obstructive sleep apnea.  Stress. What are the signs or symptoms? Extremely high blood pressure (hypertensive crisis) may cause:  Headache.  Anxiety.  Shortness of breath.  Nosebleed.  Nausea and vomiting.  Severe chest pain.  Jerky movements you cannot control (seizures). How is this diagnosed? This condition is diagnosed by measuring your blood pressure while you are seated,  with your arm resting on a surface. The cuff of the blood pressure monitor will be placed directly against the skin of your upper arm at the level of your heart. It should be measured at least twice using the same arm. Certain conditions can cause a difference in blood pressure between your right and left arms. Certain factors can cause blood pressure readings to be lower or higher than normal (elevated) for a short period of time:  When your blood pressure is higher when you are in a health care provider's office than when you are at home, this is called white coat hypertension. Most people with this condition do not need medicines.  When your blood pressure is higher  at home than when you are in a health care provider's office, this is called masked hypertension. Most people with this condition may need medicines to control blood pressure. If you have a high blood pressure reading during one visit or you have normal blood pressure with other risk factors:  You may be asked to return on a different day to have your blood pressure checked again.  You may be asked to monitor your blood pressure at home for 1 week or longer. If you are diagnosed with hypertension, you may have other blood or imaging tests to help your health care provider understand your overall risk for other conditions. How is this treated? This condition is treated by making healthy lifestyle changes, such as eating healthy foods, exercising more, and reducing your alcohol intake. Your health care provider may prescribe medicine if lifestyle changes are not enough to get your blood pressure under control, and if:  Your systolic blood pressure is above 130.  Your diastolic blood pressure is above 80. Your personal target blood pressure may vary depending on your medical conditions, your age, and other factors. Follow these instructions at home: Eating and drinking   Eat a diet that is high in fiber and potassium, and low in sodium, added sugar, and fat. An example eating plan is called the DASH (Dietary Approaches to Stop Hypertension) diet. To eat this way: ? Eat plenty of fresh fruits and vegetables. Try to fill half of your plate at each meal with fruits and vegetables. ? Eat whole grains, such as whole wheat pasta, brown rice, or whole grain bread. Fill about one quarter of your plate with whole grains. ? Eat or drink low-fat dairy products, such as skim milk or low-fat yogurt. ? Avoid fatty cuts of meat, processed or cured meats, and poultry with skin. Fill about one quarter of your plate with lean proteins, such as fish, chicken without skin, beans, eggs, and tofu. ? Avoid premade and  processed foods. These tend to be higher in sodium, added sugar, and fat.  Reduce your daily sodium intake. Most people with hypertension should eat less than 1,500 mg of sodium a day.  Limit alcohol intake to no more than 1 drink a day for nonpregnant women and 2 drinks a day for men. One drink equals 12 oz of beer, 5 oz of wine, or 1 oz of hard liquor. Lifestyle   Work with your health care provider to maintain a healthy body weight or to lose weight. Ask what an ideal weight is for you.  Get at least 30 minutes of exercise that causes your heart to beat faster (aerobic exercise) most days of the week. Activities may include walking, swimming, or biking.  Include exercise to strengthen your muscles (resistance exercise), such as  pilates or lifting weights, as part of your weekly exercise routine. Try to do these types of exercises for 30 minutes at least 3 days a week.  Do not use any products that contain nicotine or tobacco, such as cigarettes and e-cigarettes. If you need help quitting, ask your health care provider.  Monitor your blood pressure at home as told by your health care provider.  Keep all follow-up visits as told by your health care provider. This is important. Medicines  Take over-the-counter and prescription medicines only as told by your health care provider. Follow directions carefully. Blood pressure medicines must be taken as prescribed.  Do not skip doses of blood pressure medicine. Doing this puts you at risk for problems and can make the medicine less effective.  Ask your health care provider about side effects or reactions to medicines that you should watch for. Contact a health care provider if:  You think you are having a reaction to a medicine you are taking.  You have headaches that keep coming back (recurring).  You feel dizzy.  You have swelling in your ankles.  You have trouble with your vision. Get help right away if:  You develop a severe  headache or confusion.  You have unusual weakness or numbness.  You feel faint.  You have severe pain in your chest or abdomen.  You vomit repeatedly.  You have trouble breathing. Summary  Hypertension is when the force of blood pumping through your arteries is too strong. If this condition is not controlled, it may put you at risk for serious complications.  Your personal target blood pressure may vary depending on your medical conditions, your age, and other factors. For most people, a normal blood pressure is less than 120/80.  Hypertension is treated with lifestyle changes, medicines, or a combination of both. Lifestyle changes include weight loss, eating a healthy, low-sodium diet, exercising more, and limiting alcohol. This information is not intended to replace advice given to you by your health care provider. Make sure you discuss any questions you have with your health care provider. Document Released: 11/01/2005 Document Revised: 09/29/2016 Document Reviewed: 09/29/2016 Elsevier Interactive Patient Education  2019 [employer].

## 2019-03-28 NOTE — Discharge Summary (Signed)
[facility] Discharge Summary  Patient name: Andres Murray Medical record number: [id number] Date of birth: 1934-08-29 Age: 85 y.o. Gender: male Date of Admission: 03/27/2019  Date of Discharge: 03/28/2019    Admitting Physician: Dickie La, MD  Primary Care Provider: [name], MD Consultants: [place]  Indication for Hospitalization: <principal problem not specified>   Discharge Diagnoses/Problem List:  Active Problems:   Anxiety   Chronic cough   Mild cognitive impairment   Paresthesia   Dementia ([place])   Gait abnormality   Hypertensive emergency without congestive heart failure   Hypertensive urgency   Hypertension  Disposition: ALF  Discharge Condition: Good  Discharge Exam:  BP (!) 172/82   Pulse 82   Temp 98.4 F (36.9 C) (Oral)   Resp (!) 28   Ht 5\' 5"  (1.651 m)   Wt 62.1 kg   SpO2 93%   BMI 22.80 kg/m  General: NAD, non-toxic, well-appearing, sitting comfortably in bed eating breakfast   Cardiovascular: RRR, normal S1, S2. 2+ RP bilaterally. No BLEE Respiratory: CTAB. No IWOB.  Abdomen: + BS. NT, ND, soft to palpation.  Extremities: Warm and well perfused. Moving spontaneously.  Neuro: Alert & oriented x 4. CN grossly intact.    Brief Hospital Course:  Jasmine Maceachern is a 85 y.o. male with past medical history significant for hypertension, hyperlipidemia, anxiety, dementia, mild cognitive impairment, who presented with hypertensive crisis.  At home, patient was on Toprol-XL 25 mg daily, but no other hypertension medications.  He also reported increased stress as he recently moved to an assisted living facility that he dislikes greatly.  His initial blood pressures were greater than 200/100.  Troponins trended flat at 0.034.  TSH was normal.  BNP was mildly elevated at 204.4, A1c was 5.7, TSH within normal limits.  CT head was negative except for chronic atrophy.  Patient was started on losartan  50 mg and amlodipine 10 mg, both dose nightly as there is good evidence for improved outcomes.  Patient remained on metoprolol XL, but would benefit from slow tapering regimen as there are other antihypertensives that would suit this patient better.    We also obtained echocardiogram which showed grade 1 diastolic dysfunction and no systolic dysfunction or valvular abnormalities.  Patient's blood pressure remained improved, although still elevated, on 5/13, and his last measurement prior to discharge was 176/92.  Since his blood pressure can continue to be managed on an outpatient basis, he was deemed appropriate for discharge.  Patient was bladder scanned prior to discharge to ensure that he was not retaining urine, and bladder scan showed 48 mL, which was reassuring.  Issues for Follow Up:  1. Improvement of hypertension 2. Continue amlodipine 10 mg and losartan 50mg . Please titrate as needed.  Would avoid diuretics in this patient as he has history of urinary retention.  3. Taper metoprolol down slowly.  This medication is not an especially effective hypertension medication, and his echo did not show signs of systolic dysfunction or CAD, so he can eventually stop this. 4. Consider SSRI therapy for patient for anxiety 5. Please closely monitor patient for signs of urinary retention.  Significant Procedures: Echocardiogram  Procedure Orders     EKG 12-Lead     EKG 12-Lead     EKG 12-Lead     EKG     EKG 12-Lead     ECHOCARDIOGRAM LIMITED  Significant Labs and Imaging:  Recent Labs  Lab 03/27/19 1332  03/28/19 0624  WBC 6.9 6.4  HGB 13.8 14.2  HCT 41.9 42.9  PLT 141* 149*   Recent Labs  Lab 03/27/19 1332 03/27/19 1906 03/28/19 0009  NA 141  --  140  K 3.5  --  3.2*  CL 105  --  104  CO2 26  --  28  GLUCOSE 141*  --  122*  BUN 16  --  19  CREATININE 1.00  --  1.03  CALCIUM 9.4  --  9.5  MG  --  1.9  --   ALKPHOS 53  --   --   AST 21  --   --   ALT 18  --   --   ALBUMIN  3.6  --   --     Dg Chest 1 View  Result Date: 03/27/2019 CLINICAL DATA:  Dizziness.  Hypertension. EXAM: CHEST  1 VIEW COMPARISON:  Chest x-ray dated 09/06/2016. FINDINGS: The lung volumes are low. There is an opacity at the left lung base favored to represent atelectasis or scarring. The cardiac silhouette is mildly enlarged. No pneumothorax. No large pleural effusion. No acute osseous abnormality. IMPRESSION: 1. Low lung volumes. 2. Probable atelectasis at the left lung base. Electronically Signed   By: Constance Holster M.D.   On: 03/27/2019 14:15   Ct Head Wo Contrast  Result Date: 03/27/2019 CLINICAL DATA:  Hypertension and blurry vision EXAM: CT HEAD WITHOUT CONTRAST TECHNIQUE: Contiguous axial images were obtained from the base of the skull through the vertex without intravenous contrast. COMPARISON:  02/25/2019 FINDINGS: Brain: Chronic atrophic changes and white matter ischemic changes are seen. No findings to suggest acute hemorrhage, acute infarction or space-occupying mass lesion are seen. Vascular: No hyperdense vessel or unexpected calcification. Skull: Normal. Negative for fracture or focal lesion. Sinuses/Orbits: No acute finding. Other: None. IMPRESSION: Chronic atrophic and ischemic changes without acute abnormality. Electronically Signed   By: Inez Catalina M.D.   On: 03/27/2019 14:43    Results/Tests Pending at Time of Discharge:  . None  Discharge Medications:  Allergies as of 03/28/2019   No Known Allergies     Medication List    STOP taking these medications   naproxen sodium 220 MG tablet Commonly known as:  ALEVE     TAKE these medications   amLODipine 10 MG tablet Commonly known as:  NORVASC Take 1 tablet (10 mg total) by mouth at bedtime.   atorvastatin 20 MG tablet Commonly known as:  LIPITOR Take 20 mg by mouth at bedtime.   donepezil 10 MG tablet Commonly known as:  ARICEPT Take 1 tablet (10 mg total) by mouth at bedtime. What changed:  when to take  this   losartan 50 MG tablet Commonly known as:  COZAAR Take 1 tablet (50 mg total) by mouth daily.   Melatonin 10 MG Tabs Take 20 mg by mouth at bedtime.   memantine 10 MG tablet Commonly known as:  Namenda Take 1 tablet (10 mg total) by mouth 2 (two) times daily.   metoprolol succinate 25 MG 24 hr tablet Commonly known as:  TOPROL-XL Take 25 mg by mouth daily.   omeprazole 20 MG tablet Commonly known as:  PRILOSEC OTC Take 20 mg by mouth daily before breakfast.   tamsulosin 0.4 MG Caps capsule Commonly known as:  FLOMAX Take 0.4 mg by mouth daily.   triamcinolone cream 0.1 % Commonly known as:  KENALOG Apply 1 application topically daily as needed (for skin condition).  Discharge Instructions: Please refer to Patient Instructions section of EMR for full details.  Patient was counseled important signs and symptoms that should prompt return to medical care, changes in medications, dietary instructions, activity restrictions, and follow up appointments.   Follow-Up Appointments: No future appointments.   Wilber Oliphant, MD 03/28/2019, 3:59 PM PGY-1, [place]

## 2019-03-28 NOTE — Progress Notes (Signed)
Patient refuses to remove condom catheter and use urinal.  Dr Maudie Mercury notified by pager

## 2019-03-28 NOTE — NC FL2 (Signed)
[place] MEDICAID FL2 LEVEL OF CARE SCREENING TOOL     IDENTIFICATION  Patient Name: Andres Murray Birthdate: 08/24/1934 Sex: male Admission Date (Current Location): 03/27/2019  [facility] and [place] Number:  Herbalist and Address:  [facility] [place] [address], [place], [place] [address]      Provider Number: [id number]  Attending Physician Name and Address:  Dickie La, MD  Relative Name and Phone Number:  Jonita Albee; daughter; [phone]    Current Level of Care: Hospital Recommended Level of Care: [place] Prior Approval Number:    Date Approved/Denied:   PASRR Number:    Discharge Plan: Other (Comment)([facility])    Current Diagnoses: Patient Active Problem List   Diagnosis Date Noted  . Hypertensive emergency without congestive heart failure 03/27/2019  . Hypertensive urgency 03/27/2019  . Hypertension 03/27/2019  . Gait abnormality 03/06/2019  . Dementia ([place]) 09/26/2017  . Paresthesia 03/24/2017  . Mild cognitive impairment 11/03/2016  . Anxiety 09/06/2016  . Abnormal brain MRI 09/06/2016  . Chronic cough 09/06/2016    Orientation RESPIRATION BLADDER Height & Weight     Self, Time, Situation, Place  Normal Continent Weight: 137 lb (62.1 kg) Height:  5\' 5"  (165.1 cm)  BEHAVIORAL SYMPTOMS/MOOD NEUROLOGICAL BOWEL NUTRITION STATUS      Continent Diet(regular diet; thin liquids)  AMBULATORY STATUS COMMUNICATION OF NEEDS Skin   Supervision Verbally Normal                       Personal Care Assistance Level of Assistance  Bathing, Feeding, Dressing Bathing Assistance: Independent Feeding assistance: Independent Dressing Assistance: Independent     Functional Limitations Info  Sight, Hearing, Speech Sight Info: Adequate Hearing Info: Adequate Speech Info: Adequate    SPECIAL CARE FACTORS FREQUENCY         Contractures Contractures Info: Not present    Additional  Factors Info  Code Status, Allergies, Psychotropic Code Status Info: Partial Code- no CPR; no ACLS medications; no cardioversion or defibrillation per Epic Allergies Info: No Known Allergies Psychotropic Info: donepezil (ARICEPT) tablet 10 mg daily at bedtime PO; memantine (NAMENDA) tablet 10 mg 2x daily PO         Current Medications (03/28/2019):  This is the current hospital active medication list Current Facility-Administered Medications  Medication Dose Route Frequency Provider Last Rate Last Dose  . acetaminophen (TYLENOL) tablet 650 mg  650 mg Oral Q6H PRN Wilber Oliphant, MD       Or  . acetaminophen (TYLENOL) suppository 650 mg  650 mg Rectal Q6H PRN Wilber Oliphant, MD      . amLODipine (NORVASC) tablet 10 mg  10 mg Oral QHS [place] Derry, [place], DO      . atorvastatin (LIPITOR) tablet 20 mg  20 mg Oral QHS Wilber Oliphant, MD   20 mg at 03/27/19 2022  . donepezil (ARICEPT) tablet 10 mg  10 mg Oral QHS Wilber Oliphant, MD      . enoxaparin (LOVENOX) injection 40 mg  40 mg Subcutaneous Q24H Wilber Oliphant, MD   40 mg at 03/27/19 2023  . labetalol (NORMODYNE) injection 10 mg  10 mg Intravenous Q2H PRN Shirley, [place], DO      . losartan (COZAAR) tablet 50 mg  50 mg Oral Daily Shirley, [place], DO      . Melatonin TABS 18 mg  18 mg Oral QHS [place], [facility]  18 mg at 03/27/19 2158  . memantine (NAMENDA) tablet 10 mg  10 mg Oral BID Wilber Oliphant, MD   10 mg at 03/28/19 1012  . [START ON 03/29/2019] metoprolol succinate (TOPROL-XL) 24 hr tablet 25 mg  25 mg Oral Daily Wilber Oliphant, MD      . pantoprazole (PROTONIX) EC tablet 20 mg  20 mg Oral Daily Dickie La, MD   20 mg at 03/28/19 1016  . polyethylene glycol (MIRALAX / GLYCOLAX) packet 17 g  17 g Oral Daily PRN Wilber Oliphant, MD      . tamsulosin [facility]) capsule 0.4 mg  0.4 mg Oral Daily Wilber Oliphant, MD   0.4 mg at 03/28/19 1010     Discharge Medications: Please see discharge summary for a list of discharge medications.  Relevant  Imaging Results:  Relevant Lab Results:   Additional Information SS# [place], [place]

## 2019-03-28 NOTE — Progress Notes (Signed)
  Echocardiogram 2D Echocardiogram has been performed.  Jennette Dubin 03/28/2019, 8:51 AM

## 2019-03-28 NOTE — Progress Notes (Addendum)
Family Medicine Teaching Service Daily Progress Note Intern Pager: [phone]  Patient name: Andres Murray Medical record number: [id number] Date of birth: [birth year] Age: 85 y.o. Gender: male  Primary Care Provider: [name], MD Consultants: none Code Status: Partial Code   Pt Overview and Major Events to Date:  Hospital Day: 2 03/27/2019: admitted for Hypertension; Blurred Vision; and Dizziness  Assessment and Plan: Andres Murray is a 85 y.o. male who presented w/ Hypertension; Blurred Vision; and Dizziness Hypertension, blurred vision, dizziness, admitted for hypertensive emergency.  Past medical history is significant for anxiety, HLD, HTN, persistent cough, dementia, MCI.  #Hypertensive emergency Overnight, patient's blood pressures continue to be elevated from 162-194/75-97. His EKG this morning with posible t wave abnormalities in inferior leads.  Will consider starting ACE or arm as patient previously on this medication and appears to have tolerated well. Should eventually slowly off of metoprolol as not the best option for this patient, unless echo tells [place] otherwise. Start losartan. As troponin trending flat and no CP, will repeat troponin once blood pressures lower and stablize. Avoid HCTZ w/ hx of urinary retention   Continue telemetry with continuous cardiac monitoring  PT/OT  Follow-up echocardiogram read  Continue metoprolol  Add losartan 50 daily   Continue amlodipine 10, titrate up as needed   Will repeat troponin with decreased blood pressures  PRN IV labetalol > 220/110  HTN education, DASH diet  Call ALF for possible room change?  #History of prostate cancer #Urinary retention, chronic Night team reports urine overnight. None charted.   Strict IO  Monitor UO  D/C condom cath   Urinal at bedside  continue home flomax 0.4mg   #Dementia #Mild cognitive impairment Followed by Dr. Evelena Leyden at [facility].  He does not have issue  recalling events or history. At home, patient states Namenda 10 mg twice daily, donezepil 10 mg at bedtime. Patient is alert and oriented x4 during exam.  He speaks clearly and fluently.  Continue home Namenda and donezepil  Delirium precautions  #Gait abnormality At home, patient can walk by himself but doesn't walk much at home. Does use walker or cane if needed.   PT   Out of bed with assistance  #HLD, well controlled  lipid panel within normal limits. On atorvastatin 40 mg every evening at home.  Continue atorvastatin 40 mg  #Prediabetes A1c is 5.7.    Outpatient management with PCP   #FEN/GI:   Nutrition: Heart healthy  Access: L AC PIV VTE prophylaxis: Lovenox 40 (CrCl>30) Disposition: Back to ALF when medically stable   Subjective:  NAEO.   Objective: Temp:  [97.6 F (36.4 C)-98.6 F (37 C)] 97.9 F (36.6 C) (05/13 0000) Pulse Rate:  [59-76] 59 (05/13 0000) Cardiac Rhythm: Normal sinus rhythm (05/12 2029) Resp:  [18-37] 19 (05/12 2000) BP: (160-214)/(75-102) 160/75 (05/13 0000) SpO2:  [95 %-99 %] 96 % (05/13 0000) Weight:  [62.1 kg] 62.1 kg (05/12 1252) No intake/output data recorded.   Physical Exam: General: NAD, non-toxic, well-appearing, sitting comfortably in bed eating breakfast   Cardiovascular: RRR, normal S1, S2. 2+ RP bilaterally. No BLEE Respiratory: CTAB. No IWOB.  Abdomen: + BS. NT, ND, soft to palpation.  Extremities: Warm and well perfused. Moving spontaneously.  Neuro: Alert & oriented x 4. CN grossly intact.    Laboratory: I have personally read and reviewed all labs and imaging studies.  CBC: Recent Labs  Lab 03/27/19 1332  WBC 6.9  NEUTROABS 5.6  HGB 13.8  HCT  41.9  MCV 93.9  PLT 141*   CMP: Recent Labs  Lab 03/27/19 1332 03/27/19 1906 03/28/19 0009  NA 141  --  140  K 3.5  --  3.2*  CL 105  --  104  CO2 26  --  28  GLUCOSE 141*  --  122*  BUN 16  --  19  CREATININE 1.00  --  1.03  CALCIUM 9.4  --  9.5   MG  --  1.9  --   ALBUMIN 3.6  --   --      Wilber Oliphant, MD 03/28/2019, 6:00 AM PGY-1, [place] Intern pager: [phone], text pages welcome

## 2019-03-30 NOTE — Progress Notes (Signed)
EKG that was done at 7:06 am  does not belong to this patient. Dr Maudie Mercury was notified and another ekg was done at 12:54.

## 2019-06-11 ENCOUNTER — Emergency Department (HOSPITAL_BASED_OUTPATIENT_CLINIC_OR_DEPARTMENT_OTHER)
Admission: EM | Admit: 2019-06-11 | Discharge: 2019-06-12 | Disposition: A | Discharge status: Home / Self Care | Payer: Medicare Other | Attending: Emergency Medicine | Admitting: Emergency Medicine

## 2019-06-11 ENCOUNTER — Other Ambulatory Visit: Payer: Self-pay

## 2019-06-11 ENCOUNTER — Encounter (HOSPITAL_BASED_OUTPATIENT_CLINIC_OR_DEPARTMENT_OTHER): Payer: Self-pay | Admitting: *Deleted

## 2019-06-11 DIAGNOSIS — F0390 Unspecified dementia without behavioral disturbance: Secondary | ICD-10-CM | POA: Diagnosis not present

## 2019-06-11 DIAGNOSIS — Z87891 Personal history of nicotine dependence: Secondary | ICD-10-CM | POA: Diagnosis not present

## 2019-06-11 DIAGNOSIS — Z79899 Other long term (current) drug therapy: Secondary | ICD-10-CM | POA: Insufficient documentation

## 2019-06-11 DIAGNOSIS — I10 Essential (primary) hypertension: Secondary | ICD-10-CM | POA: Insufficient documentation

## 2019-06-11 MED ORDER — LOSARTAN POTASSIUM 25 MG PO TABS
50.0000 mg | ORAL_TABLET | ORAL | Status: AC
  Administered 2019-06-12: 50 mg via ORAL
  Filled 2019-06-11: qty 2

## 2019-06-11 MED ORDER — LOSARTAN POTASSIUM 100 MG PO TABS: 100.0000 mg | ORAL_TABLET | Freq: Every day | ORAL | 6 refills | Status: DC

## 2019-06-11 NOTE — ED Notes (Signed)
Unable to locate pt  

## 2019-06-11 NOTE — ED Notes (Addendum)
Happened to see pt walking to the bathroom with his son. He had been sitting outside to stay warm. Pt states he is not having any test done. All he wants is a Rx for BP medication.

## 2019-06-11 NOTE — ED Triage Notes (Signed)
His neighbor took his BP today and it was elevated. No symptoms. States his BP medication is not strong enough. States he lives at [facility] assisted [facility]. They give his medications. States he would take 2 if he had his way.

## 2019-06-11 NOTE — ED Provider Notes (Signed)
[name]   CSN: [id number] Arrival date & time: 06/11/19  1953    History   Chief Complaint Chief Complaint  Patient presents with  . Hypertension    HPI Andres Murray is a 85 y.o. male.     Patient presents to the emergency department for evaluation of elevated blood pressure.  Patient reports that he has noticed that his blood pressure has been persistently elevated.  He was admitted to [facility] in May for similar.  Medication changes were performed at that time.  Despite this, he has had persistent symptoms and has been having difficulty getting in touch with his doctor to make changes.  He does not have a headache, blurred vision, chest pain, heart palpitations, shortness of breath or extremity swelling.     Past Medical History:  Diagnosis Date  . Anxiety   . Hyperlipemia   . Hypertension   . Persistent dry cough     Patient Active Problem List   Diagnosis Date Noted  . Hypertensive emergency without congestive heart failure 03/27/2019  . Hypertensive urgency 03/27/2019  . Hypertension 03/27/2019  . Gait abnormality 03/06/2019  . Dementia ([place]) 09/26/2017  . Paresthesia 03/24/2017  . Mild cognitive impairment 11/03/2016  . Anxiety 09/06/2016  . Abnormal brain MRI 09/06/2016  . Chronic cough 09/06/2016    Past Surgical History:  Procedure Laterality Date  . KNEE SURGERY Right   . TONSILLECTOMY          Home Medications    Prior to Admission medications   Medication Sig Start Date End Date Taking? Authorizing Provider  amLODipine (NORVASC) 10 MG tablet Take 1 tablet (10 mg total) by mouth at bedtime. 03/28/19   Kathrene Alu, MD  atorvastatin (LIPITOR) 20 MG tablet Take 20 mg by mouth at bedtime.    [provider]  donepezil (ARICEPT) 10 MG tablet Take 1 tablet (10 mg total) by mouth at bedtime. Patient taking differently: Take 10 mg by mouth daily.  03/30/18   Marcial Pacas, MD  losartan  (COZAAR) 100 MG tablet Take 1 tablet (100 mg total) by mouth daily. 06/11/19   Orpah Greek, MD  Melatonin 10 MG TABS Take 20 mg by mouth at bedtime.    [provider]  memantine (NAMENDA) 10 MG tablet Take 1 tablet (10 mg total) by mouth 2 (two) times daily. 03/30/18   Marcial Pacas, MD  metoprolol succinate (TOPROL-XL) 25 MG 24 hr tablet Take 25 mg by mouth daily.    [provider]  omeprazole (PRILOSEC OTC) 20 MG tablet Take 20 mg by mouth daily before breakfast.    [provider]  tamsulosin (FLOMAX) 0.4 MG CAPS capsule Take 0.4 mg by mouth daily.    [provider]  triamcinolone cream (KENALOG) 0.1 % Apply 1 application topically daily as needed (for skin condition).     [provider]    Family History Family History  Problem Relation Age of Onset  . Diabetes Mother   . Appendicitis Father     Social History Social History   Tobacco Use  . Smoking status: Former Research scientist (life sciences)  . Smokeless tobacco: Never Used  Substance Use Topics  . Alcohol use: Yes    Comment: Occasional beer  . Drug use: No     Allergies   Patient has no known allergies.   Review of Systems Review of Systems  Respiratory: Negative for cough and shortness of breath.   Cardiovascular: Negative  for chest pain.  All other systems reviewed and are negative.    Physical Exam Updated Vital Signs BP (!) 183/100   Pulse 66   Temp 98.1 F (36.7 C) (Oral)   Resp (!) 22   Ht 5\' 5"  (1.651 m)   Wt 62.6 kg   SpO2 98%   BMI 22.96 kg/m   Physical Exam Vitals signs and nursing note reviewed.  Constitutional:      General: He is not in acute distress.    Appearance: Normal appearance. He is well-developed.  HENT:     Head: Normocephalic and atraumatic.     Right Ear: Hearing normal.     Left Ear: Hearing normal.     Nose: Nose normal.  Eyes:     Conjunctiva/sclera: Conjunctivae normal.     Pupils: Pupils are equal, round, and reactive to light.   Neck:     Musculoskeletal: Normal range of motion and neck supple.  Cardiovascular:     Rate and Rhythm: Regular rhythm.     Heart sounds: S1 normal and S2 normal. No murmur. No friction rub. No gallop.   Pulmonary:     Effort: Pulmonary effort is normal. No respiratory distress.     Breath sounds: Normal breath sounds.  Chest:     Chest wall: No tenderness.  Abdominal:     General: Bowel sounds are normal.     Palpations: Abdomen is soft.     Tenderness: There is no abdominal tenderness. There is no guarding or rebound. Negative signs include Murphy's sign and McBurney's sign.     Hernia: No hernia is present.  Musculoskeletal: Normal range of motion.  Skin:    General: Skin is warm and dry.     Findings: No rash.  Neurological:     Mental Status: He is alert and oriented to person, place, and time.     GCS: GCS eye subscore is 4. GCS verbal subscore is 5. GCS motor subscore is 6.     Cranial Nerves: No cranial nerve deficit.     Sensory: No sensory deficit.     Coordination: Coordination normal.  Psychiatric:        Speech: Speech normal.        Behavior: Behavior normal.        Thought Content: Thought content normal.      ED Treatments / Results  Labs (all labs ordered are listed, but only abnormal results are displayed) Labs Reviewed - No data to display  EKG None  Radiology No results found.  Procedures Procedures (including critical care time)  Medications Ordered in ED Medications  losartan (COZAAR) tablet 50 mg (has no administration in time range)     Initial Impression / Assessment and Plan / ED Course  I have reviewed the triage vital signs and the nursing notes.  Pertinent labs & imaging results that were available during my care of the patient were reviewed by me and considered in my medical decision making (see chart for details).        Patient appears well.  Blood pressure is elevated here in the ER but he is asymptomatic.  Recommended  some blood work to make sure he is not having any renal involvement or other abnormalities as a result of his blood pressure or as a cause of his blood pressure.  Patient indicates that he does not wish to have any blood work performed.  He would like his blood pressure medication adjusted.  He is already  maxed out on his Norvasc, will increase his Cozaar.  He has apparently been on diuretics in the past and not done well because of urinary retention.  He was recently weaned off a beta-blocker because it was not effective.  We will stick with his current medications and try to maximize.  Final Clinical Impressions(s) / ED Diagnoses   Final diagnoses:  Essential hypertension    ED Discharge Orders         Ordered    losartan (COZAAR) 100 MG tablet  Daily     06/11/19 2337           Orpah Greek, MD 06/11/19 2338

## 2019-06-11 NOTE — ED Notes (Signed)
No answer when called 

## 2019-06-11 NOTE — Discharge Instructions (Addendum)
If blood pressure remains high, a third agent might be needed.  I would suggest stopping the Lopressor and changing it to a different medication if these new doses do not work.

## 2019-09-03 ENCOUNTER — Other Ambulatory Visit: Payer: Self-pay

## 2019-09-03 ENCOUNTER — Encounter: Payer: Self-pay | Admitting: Neurology

## 2019-09-03 ENCOUNTER — Ambulatory Visit (INDEPENDENT_AMBULATORY_CARE_PROVIDER_SITE_OTHER): Payer: Medicare Other | Admitting: Neurology

## 2019-09-03 VITALS — BP 143/73 | HR 65 | Temp 97.6°F | Ht 65.0 in | Wt 151.2 lb

## 2019-09-03 DIAGNOSIS — G3184 Mild cognitive impairment, so stated: Secondary | ICD-10-CM

## 2019-09-03 NOTE — Progress Notes (Signed)
PATIENT: Andres Murray DOB: 01-03-1934  Chief Complaint  Patient presents with  . [name]    He was referred here to discuss his symptoms of daily anxiety and nervousness.  States he has also developed a persistant, dry cough over the last six months.  [name] PCP    [name], MD     HISTORICAL  Andres Murray is a 85 years old right-handed male, seen in refer by his primary care [facility] for evaluation of anxiety, frequent dry cough, initial evaluation was on September 06 2016.   I reviewed and summarized the referral note, he had a history of hyperlipidemia, hypertension, has been on combination of amlodipine, valsartan treatment for more than 10 years, he is going through a lot of stress recently, he is going to move out of his house to assistant living around January 2018, he has to give up his dog, his wife suffered memory loss, he was recently diagnosed with prostate cancer in August 2017, will have treatment soon.  Around spring of 2017, he began to notice worsening anxiety, feeling nervousness, frequent dry cough, body movement, difficulty falling to sleep, over past few months, he has been taking Tylenol or Advil PM, which helped her symptoms some.  During interview, he was noticed to have frequent dry cough, shoulder shrugging body twisting, tic-like abnormal movement, he denied previous history of tic  He denies visual loss, no significant memory loss, he is a retired school principal at age 43, still enjoys reading,  We have personally reviewed MRI of the brain without contrast in March 04, 2014, extensive periventricular white matter disease consistent with small vessel disease, incidental finding of quadrigeminal plate lipoma  UPDATE Nov 03 2016: He is now taking nortriptyline 50mg  qhs, which has helped him sleep, but he still has difficulty sleepiness.  During the day, he still has frequent body jerking, clear his throat, he complains of depression anxiety, chronic  insomnia,  We have personally reviewed MRI scan on October 11 2016, moderate perisylvian atrophy, mild to moderate periventricular and subcortical chronic small vessel disease, mild progression compared to previous scan in 03-04-14  UPDATE Mar 24 2017: He had prostate cancer, he had seeding procedure, also suffered kidney stone, had a recent procedure, he wore Foley cath today, he has moved to independent living at [facility], his wife passed away in 03-04-2017, he is tearful,  Complains of worsening anxiety, motor and vocal tics, difficulty sleeping, he is also confused about the medication he is taking, complains of dizziness, dry mouth,  He also complains few weeks history of intermittent right hand paresthesia mainly involving fourth fifth fingers, radiating to right arm, he denies neck pain,  UPDATE Sep 26 2017: We have reviewed MRI brain in Nov 2017, mild atrophy, small vessel disease.  He now lives at independent living, with worsening memory loss, drives to clinic today.  He takes melatonin for insominia.  UPDATE Mar 30 2018: He is overall doing very well, lives at independent living at [facility], drive to clinic today, continue has mild memory loss, was noted to be anxious during today's interview, has frequent motor tics, vocal tics, shrugging his right shoulder, clear his throat frequently, no longer on any anti-anxiety medications.  Laboratory evaluation seen 03-04-18 showed normal CMP with glucose of 131, normal vitamin D 45, B12, TSH, negative RPR,  Virtual Visit via video on March 06 2019: Patient used to lives at [facility] independent living, daughter reported gradual decline in functional status,  he presented to the emergency room on February 25, 2019, was found by his bedside in the morning, but patient has amnesia of the event,  he does have new bruise over his left temporal region,  I personally reviewed CT head without contrast in April 2020, progressive atrophy,  small vessel disease, chronic left frontal infarction, no acute abnormality.   CT of cervical spine multilevel degenerative changes, no evidence of fracture  Family is in the process of moving him to [facility] assisted living, he was noted to have worsening confusion, gait abnormality, daughter really concerned about his ability  to drive.  UPDATE Sep 03 2019:  He moved into [facility] assisted living in April 2020, but does not like the place, wants to move out to his own house with his son soon. He denies gait abnormality, Mini-Mental Status Examination is 30 out of 30, but he is no longer driving, continue taking Namenda 10 mg twice a day, Aricept 10 mg daily  I reviewed hospital discharge on Mar 28, 2019, for elevated blood pressure 200/100, mild elevated BNP 204, echocardiogram showed grade 1 diastolic dysfunction, no systolic dysfunction, valvular abnormality, he was noted to have significant anxiety, complains of the food at [place], sleeps well,  REVIEW OF SYSTEMS: Full 14 system review of systems performed and notable only for: As above ALLERGIES: No Known Allergies  HOME MEDICATIONS: Current Outpatient Medications  Medication Sig Dispense Refill  . amLODipine (NORVASC) 10 MG tablet Take 1 tablet (10 mg total) by mouth at bedtime. 30 tablet 0  . atorvastatin (LIPITOR) 20 MG tablet Take 20 mg by mouth at bedtime.    . donepezil (ARICEPT) 10 MG tablet Take 1 tablet (10 mg total) by mouth at bedtime. (Patient taking differently: Take 10 mg by mouth daily. ) 90 tablet 4  . losartan (COZAAR) 100 MG tablet Take 1 tablet (100 mg total) by mouth daily. 30 tablet 6  . memantine (NAMENDA) 10 MG tablet Take 1 tablet (10 mg total) by mouth 2 (two) times daily. 180 tablet 4  . metoprolol succinate (TOPROL-XL) 25 MG 24 hr tablet Take 25 mg by mouth daily.    [name] omeprazole (PRILOSEC OTC) 20 MG tablet Take 20 mg by mouth daily before breakfast.    . tamsulosin (FLOMAX) 0.4 MG CAPS capsule Take 0.4  mg by mouth daily.    [name] triamcinolone cream (KENALOG) 0.1 % Apply 1 application topically daily as needed (for skin condition).      No current facility-administered medications for this visit.     PAST MEDICAL HISTORY: Past Medical History:  Diagnosis Date  . Anxiety   . Hyperlipemia   . Hypertension   . Persistent dry cough     PAST SURGICAL HISTORY: Past Surgical History:  Procedure Laterality Date  . KNEE SURGERY Right   . TONSILLECTOMY      FAMILY HISTORY: Family History  Problem Relation Age of Onset  . Diabetes Mother   . Appendicitis Father     SOCIAL HISTORY:  Social History   Socioeconomic History  . Marital status: Widowed    Spouse name: Not on file  . Number of children: 2  . Years of education: Doctorate  . Highest education level: Not on file  Occupational History  . Occupation: Retired  Scientific laboratory technician  . Financial resource strain: Not on file  . Food insecurity    Worry: Not on file    Inability: Not on file  . Transportation needs    Medical: Not  on file    Non-medical: Not on file  Tobacco Use  . Smoking status: Former Research scientist (life sciences)  . Smokeless tobacco: Never Used  Substance and Sexual Activity  . Alcohol use: Yes    Comment: Occasional beer  . Drug use: No  . Sexual activity: Not on file  Lifestyle  . Physical activity    Days per week: Not on file    Minutes per session: Not on file  . Stress: Not on file  Relationships  . Social Herbalist on phone: Not on file    Gets together: Not on file    Attends religious service: Not on file    Active member of club or organization: Not on file    Attends meetings of clubs or organizations: Not on file    Relationship status: Not on file  . Intimate partner violence    Fear of current or ex partner: Not on file    Emotionally abused: Not on file    Physically abused: Not on file    Forced sexual activity: Not on file  Other Topics Concern  . Not on file  Social History  Narrative   03/27/19: patient currently lives at [facility] assisted living.  Previously, lives at [facility] which was in independent living.  His daughter placed him at [facility] as she thought it would improve his medication compliance.  Patient's wife passed away 3 years ago.  Receives a lot of support from his kids, 41 and 22 years old, both live here in [place].      Lives at home with wife, [name].   Right-handed.   4-5 cups caffeine daily.     PHYSICAL EXAM   Vitals:   09/03/19 1258  BP: (!) 143/73  Pulse: 65  Temp: 97.6 F (36.4 C)  Weight: 151 lb 3.2 oz (68.6 kg)  Height: 5\' 5"  (1.651 m)    Not recorded      Body mass index is 25.16 kg/m.  PHYSICAL EXAMNIATION:  Gen: NAD, conversant, well nourised, obese, well groomed                     Cardiovascular: Regular rate rhythm, no peripheral edema, warm, nontender. Eyes: Conjunctivae clear without exudates or hemorrhage Neck: Supple, no carotid bruits. Pulmonary: Clear to auscultation bilaterally   NEUROLOGICAL EXAM:  MMSE - Mini Mental State Exam 03/06/2019 03/30/2018 09/26/2017  Orientation to time 5 5 5   Orientation to Place 5 5 5   Registration 3 3 3   Attention/ Calculation 3 5 2   Recall 3 2 0  Language- name 2 objects 2 2 2   Language- repeat 1 1 1   Language- follow 3 step command 3 2 3   Language- read & follow direction 1 1 1   Write a sentence 0 1 1  Copy design 0 0 1  Total score 26 27 24     MENTAL STATUS:Anxious looking elderly male, with frequent dry cough, body movement, shoulder shrugging, suggestive of motor tics,  CRANIAL NERVES: CN II: Visual fields are full to confrontation.  Pupils are round equal and briskly reactive to light. CN III, IV, VI: extraocular movement are normal. No ptosis. CN V: Facial sensation is intact to pinprick in all 3 divisions bilaterally. Corneal responses are intact.  CN VII: Face is symmetric with normal eye closure and smile. CN VIII: Hearing is normal to rubbing  fingers CN IX, X: Palate elevates symmetrically. Phonation is normal. CN XI: Head turning and shoulder shrug are  intact CN XII: Tongue is midline with normal movements and no atrophy.  MOTOR: There is no pronator drift of out-stretched arms. Muscle bulk and tone are normal. Muscle strength is normal.  REFLEXES: Reflexes are 1 and symmetric at the biceps, triceps, knees, and ankles. Plantar responses are flexor.  SENSORY: Intact to light touch,  COORDINATION: Rapid alternating movements and fine finger movements are intact. There is no dysmetria on finger-to-nose and heel-knee-shin.    GAIT/STANCE: Steady, normal gait   DIAGNOSTIC DATA (LABS, IMAGING, TESTING) - I reviewed patient records, labs, notes, testing and imaging myself where available.   ASSESSMENT AND PLAN  Andres Murray is a 85 y.o. male  Mild cognitive impairment  MMSE 30/30  MRI of the brain in November 2017, mild generalized atrophy, supratentorium small vessel disease  Keep Aricept 10mg  daily, Namenda 10mg  bid  Laboratory evaluation for treatable etiology    Worsening anxiety, motor and vocal tics, chronic insomnia   He may take melatonin for insomnia   Continue follow-up with his primary care physician  Marcial Pacas, M.D. Ph.D.  [facility] [address] [place], [place] [address] Ph: [phone] Fax: [phone]  CC: Referring Provider

## 2019-10-10 IMAGING — CT CT HEAD WITHOUT CONTRAST
3 series · 15 of 44 positions shown, 18 images · non-contrast
Comparison: 02/25/2019

CLINICAL DATA: Hypertension and blurry vision

EXAM:
CT HEAD WITHOUT CONTRAST
TECHNIQUE: Contiguous axial images were obtained from the base of the skull
through the vertex without intravenous contrast.

[Series 3: head 5.0 h30s · axial · 0.45mm/px · z∈[-19,+91]mm · 9 of 27 slices shown, 12 images]
[im 3/27  brain]
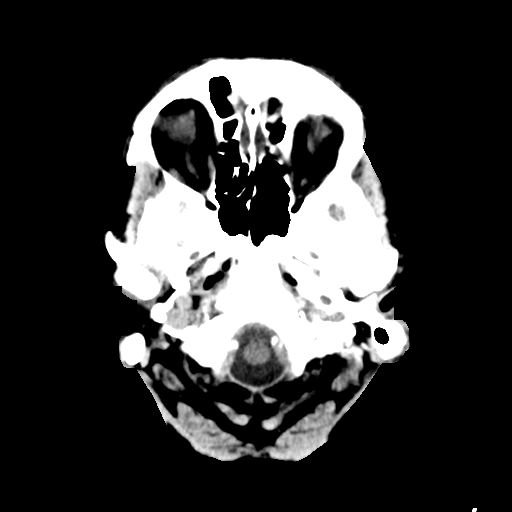
[im 3/27  bone]
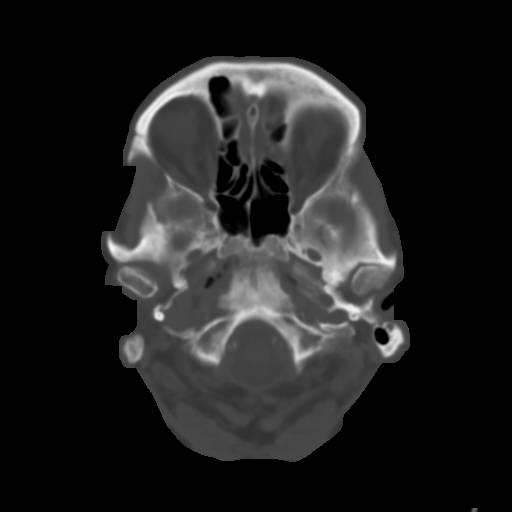
[im 6/27  brain]
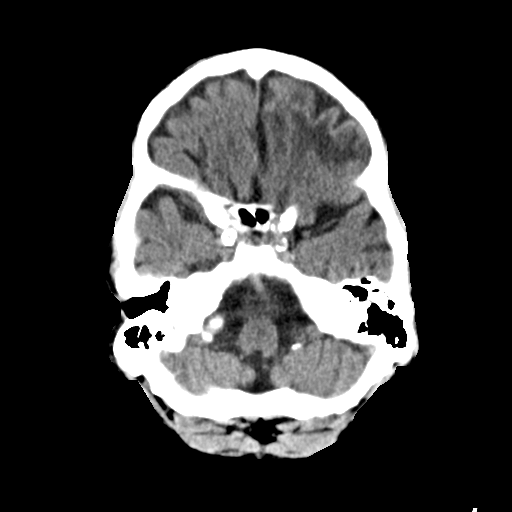
[im 8/27  brain]
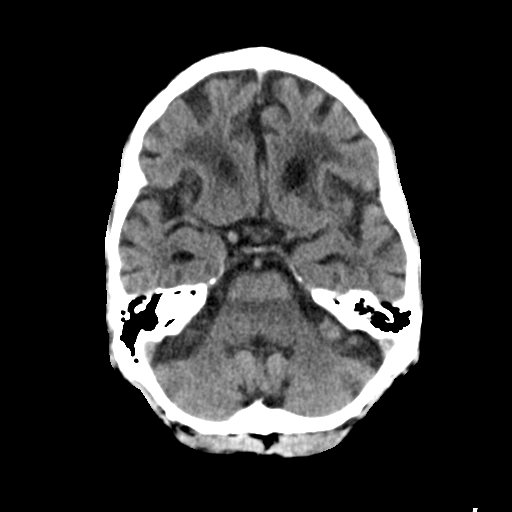
[im 11/27  brain]
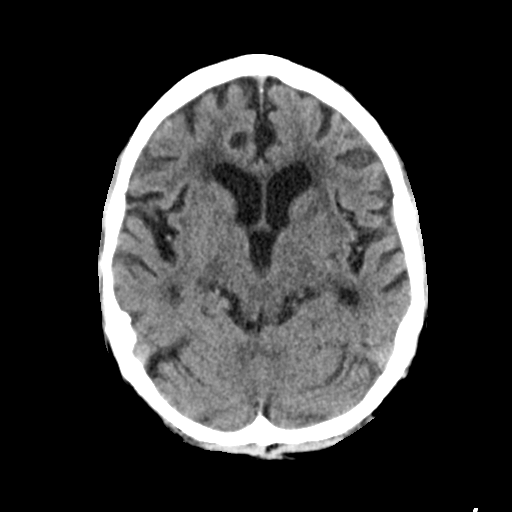
[im 14/27  brain]
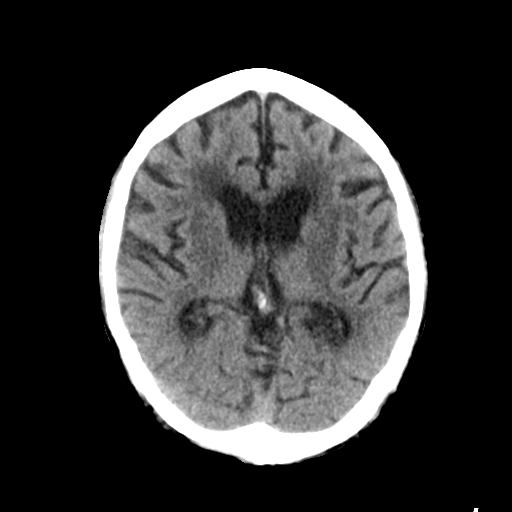
[im 14/27  bone]
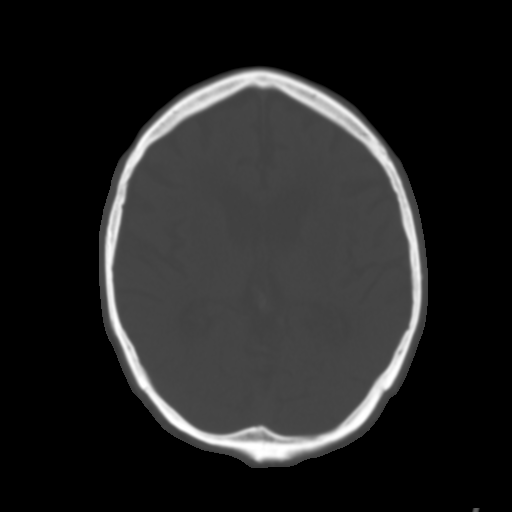
[im 17/27  brain]
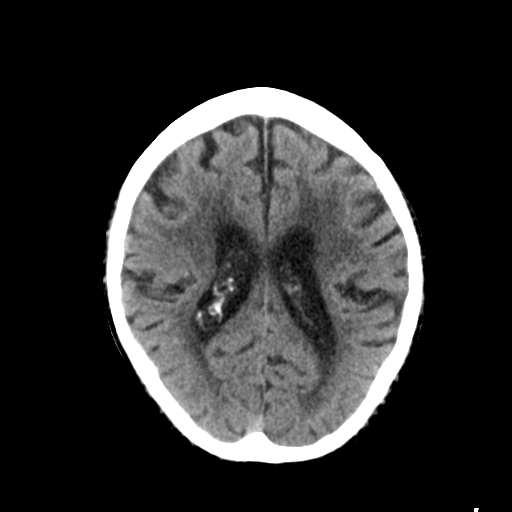
[im 20/27  brain]
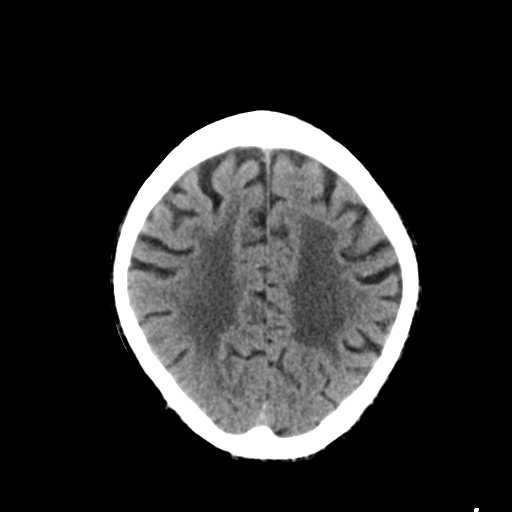
[im 22/27  brain]
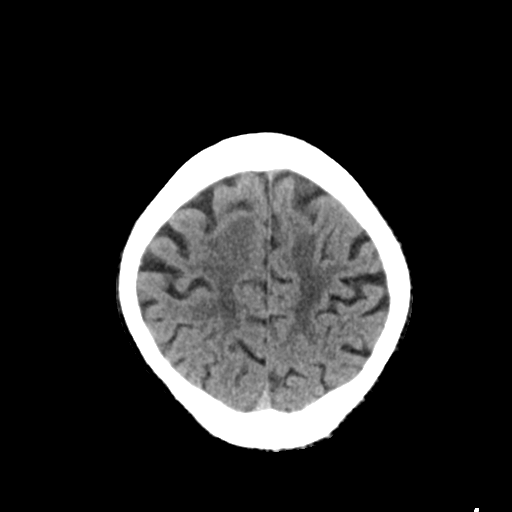
[im 25/27  brain]
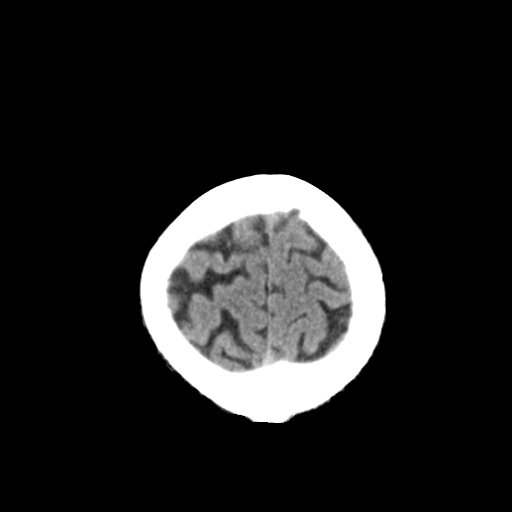
[im 25/27  bone]
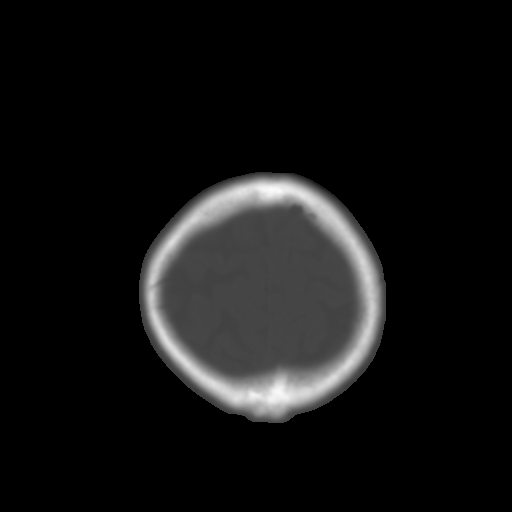

[Series 5: head 3.0 mpr cor · coronal · 0.29mm/px · 3 of 67 slices shown]
[im 23/67  brain]
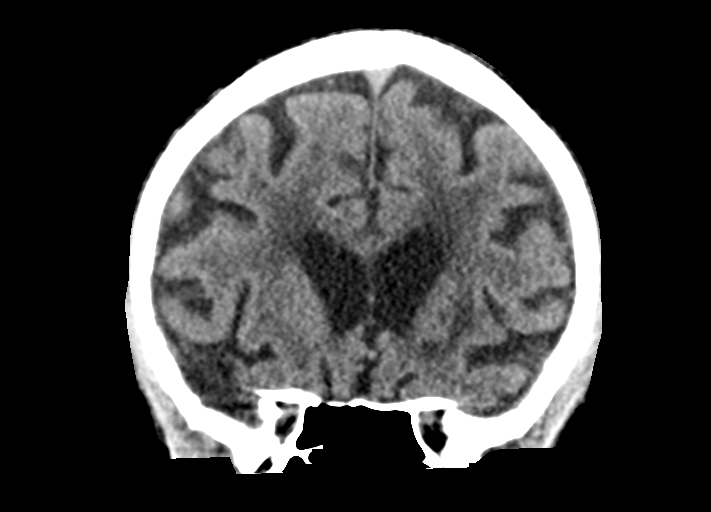
[im 30/67  brain]
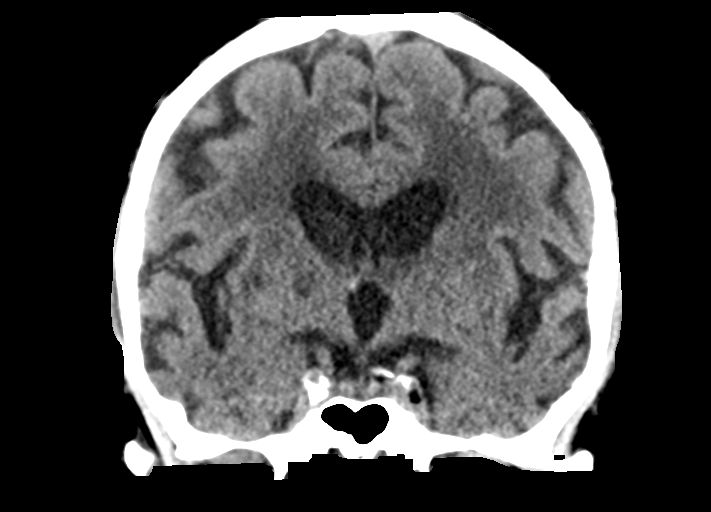
[im 37/67  brain]
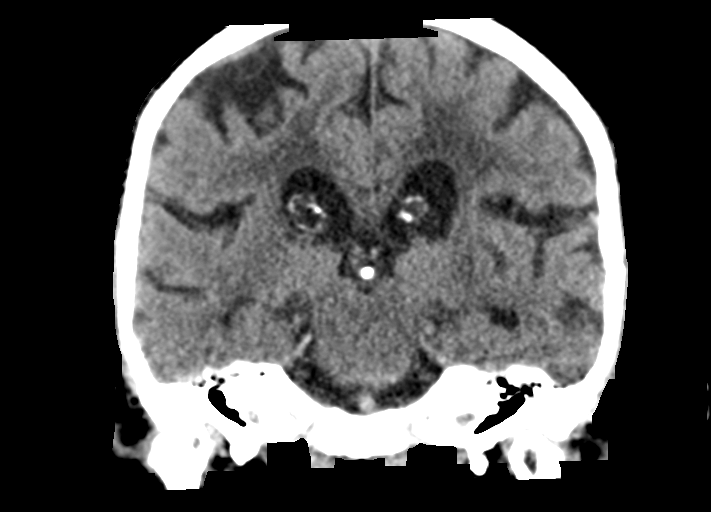

[Series 6: head 3.0 mpr sag · sagittal · 0.32mm/px · 3 of 66 slices shown]
[im 22/66  brain]
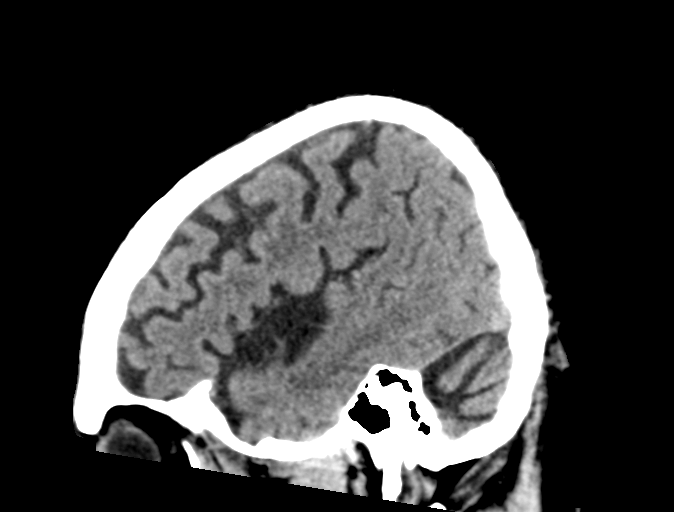
[im 33/66  brain]
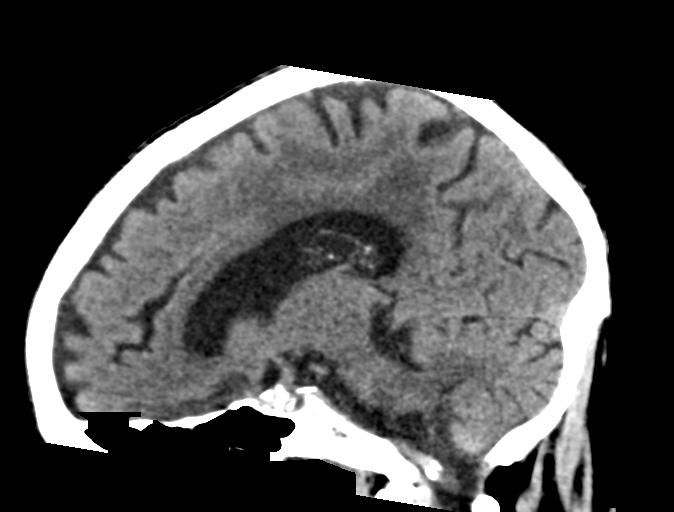
[im 44/66  brain]
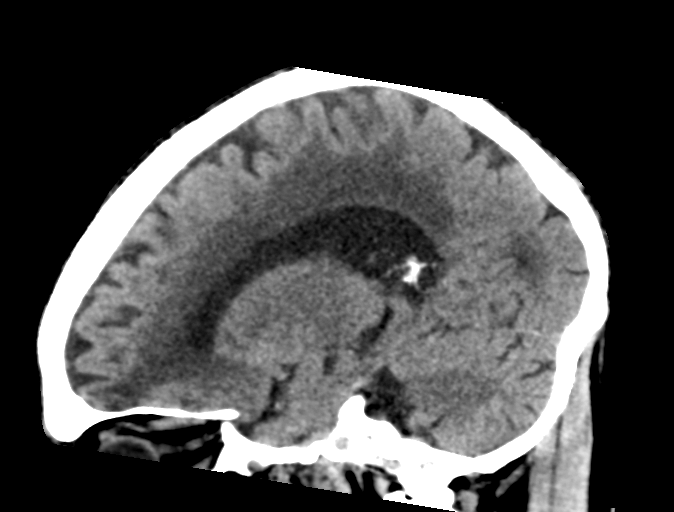

[15 of 44 positions shown; findings below may reference images not displayed]

FINDINGS: Brain: Chronic atrophic changes and white matter ischemic changes
are seen. No findings to suggest acute hemorrhage, acute infarction
or space-occupying mass lesion are seen.

Vascular: No hyperdense vessel or unexpected calcification.

Skull: Normal. Negative for fracture or focal lesion.

Sinuses/Orbits: No acute finding.

Other: None.
IMPRESSION: Chronic atrophic and ischemic changes without acute abnormality.

## 2020-09-10 ENCOUNTER — Other Ambulatory Visit: Payer: Self-pay

## 2020-09-10 ENCOUNTER — Encounter (HOSPITAL_COMMUNITY): Payer: Self-pay | Admitting: Emergency Medicine

## 2020-09-10 ENCOUNTER — Emergency Department (HOSPITAL_COMMUNITY)
Admission: EM | Admit: 2020-09-10 | Discharge: 2020-09-10 | Disposition: A | Discharge status: Home / Self Care | Payer: Medicare Other | Attending: Emergency Medicine | Admitting: Emergency Medicine

## 2020-09-10 DIAGNOSIS — R339 Retention of urine, unspecified: Secondary | ICD-10-CM | POA: Insufficient documentation

## 2020-09-10 DIAGNOSIS — F0390 Unspecified dementia without behavioral disturbance: Secondary | ICD-10-CM | POA: Insufficient documentation

## 2020-09-10 DIAGNOSIS — I10 Essential (primary) hypertension: Secondary | ICD-10-CM | POA: Insufficient documentation

## 2020-09-10 DIAGNOSIS — Z79899 Other long term (current) drug therapy: Secondary | ICD-10-CM | POA: Diagnosis not present

## 2020-09-10 DIAGNOSIS — R300 Dysuria: Secondary | ICD-10-CM | POA: Insufficient documentation

## 2020-09-10 DIAGNOSIS — N390 Urinary tract infection, site not specified: Secondary | ICD-10-CM

## 2020-09-10 DIAGNOSIS — R3911 Hesitancy of micturition: Secondary | ICD-10-CM | POA: Insufficient documentation

## 2020-09-10 LAB — CBC WITH DIFFERENTIAL/PLATELET
Abs Immature Granulocytes: 0.04 10*3/uL (ref 0.00–0.07)
Basophils Absolute: 0 10*3/uL (ref 0.0–0.1)
Basophils Relative: 0 %
Eosinophils Absolute: 0.2 10*3/uL (ref 0.0–0.5)
Eosinophils Relative: 2 %
HCT: 44.5 % (ref 39.0–52.0)
Hemoglobin: 14.2 g/dL (ref 13.0–17.0)
Immature Granulocytes: 1 %
Lymphocytes Relative: 12 %
Lymphs Abs: 1 10*3/uL (ref 0.7–4.0)
MCH: 29.9 pg (ref 26.0–34.0)
MCHC: 31.9 g/dL (ref 30.0–36.0)
MCV: 93.7 fL (ref 80.0–100.0)
Monocytes Absolute: 0.7 10*3/uL (ref 0.1–1.0)
Monocytes Relative: 8 %
Neutro Abs: 6.9 10*3/uL (ref 1.7–7.7)
Neutrophils Relative %: 77 %
Platelets: 199 10*3/uL (ref 150–400)
RBC: 4.75 MIL/uL (ref 4.22–5.81)
RDW: 13.9 % (ref 11.5–15.5)
WBC: 8.8 10*3/uL (ref 4.0–10.5)
nRBC: 0 % (ref 0.0–0.2)

## 2020-09-10 LAB — URINALYSIS, ROUTINE W REFLEX MICROSCOPIC
Bacteria, UA: NONE SEEN
Bilirubin Urine: NEGATIVE
Glucose, UA: NEGATIVE mg/dL
Ketones, ur: NEGATIVE mg/dL
Nitrite: NEGATIVE
Protein, ur: NEGATIVE mg/dL
Specific Gravity, Urine: 1.009 (ref 1.005–1.030)
WBC, UA: >50 WBC/hpf — ABNORMAL HIGH (ref 0–5)
pH: 7 (ref 5.0–8.0)

## 2020-09-10 LAB — I-STAT CHEM 8, ED
BUN: 23 mg/dL (ref 8–23)
Calcium, Ion: 1.18 mmol/L (ref 1.15–1.40)
Chloride: 104 mmol/L (ref 98–111)
Creatinine, Ser: 1.2 mg/dL (ref 0.61–1.24)
Glucose, Bld: 145 mg/dL — ABNORMAL HIGH (ref 70–99)
HCT: 43 % (ref 39.0–52.0)
Hemoglobin: 14.6 g/dL (ref 13.0–17.0)
Potassium: 4 mmol/L (ref 3.5–5.1)
Sodium: 142 mmol/L (ref 135–145)
TCO2: 26 mmol/L (ref 22–32)

## 2020-09-10 MED ORDER — TAMSULOSIN HCL 0.4 MG PO CAPS: 0.4000 mg | ORAL_CAPSULE | Freq: Every day | ORAL | 0 refills | Status: DC

## 2020-09-10 MED ORDER — CEPHALEXIN 500 MG PO CAPS: 500.0000 mg | ORAL_CAPSULE | Freq: Four times a day (QID) | ORAL | 0 refills | Status: DC

## 2020-09-10 MED ORDER — CEFTRIAXONE SODIUM 1 G IJ SOLR
1.0000 g | Freq: Once | INTRAMUSCULAR | Status: AC
  Administered 2020-09-10: 1 g via INTRAMUSCULAR
  Filled 2020-09-10: qty 10

## 2020-09-10 MED ORDER — TAMSULOSIN HCL 0.4 MG PO CAPS
0.4000 mg | ORAL_CAPSULE | ORAL | Status: AC
  Administered 2020-09-10: 0.4 mg via ORAL
  Filled 2020-09-10: qty 1

## 2020-09-10 NOTE — ED Notes (Signed)
Bladder scan x 3 showed 49ml, 32ml, and 28ml. Pt states that he just went to the bathroom and changed his brief because he had urinated

## 2020-09-10 NOTE — ED Triage Notes (Signed)
Patient reports difficulty urinating this evening , denies hematuria or fever .

## 2020-09-10 NOTE — ED Provider Notes (Signed)
[facility]Y DEPARTMENT Provider Note   CSN: [id number] Arrival date & time: 09/10/20  0106     History Chief Complaint  Patient presents with  . Urinary Tract Infection    Andres Murray is a 86 y.o. male.  The history is provided by the patient and a relative. The history is limited by the condition of the patient (level 5 caveat dementia ).  Urinary Tract Infection Presenting symptoms: dysuria   Context: not after urination   Relieved by:  Nothing Worsened by:  Nothing Ineffective treatments:  None tried Associated symptoms: urinary hesitation and urinary retention   Associated symptoms: no abdominal pain, no diarrhea, no fever, no genital lesions and no vomiting   Risk factors: no kidney stones   Patient with HTN and h/o prostate cancer presents with difficulty urinating and burning with urination.  Dysuria started at 12 pm and then became progressively worse.  No fevers, No vomiting.  Ano h/o UTI.      Past Medical History:  Diagnosis Date  . Anxiety   . Hyperlipemia   . Hypertension   . Persistent dry cough     Patient Active Problem List   Diagnosis Date Noted  . Hypertensive emergency without congestive heart failure 03/27/2019  . Hypertensive urgency 03/27/2019  . Hypertension 03/27/2019  . Gait abnormality 03/06/2019  . Dementia ([place]) 09/26/2017  . Paresthesia 03/24/2017  . Mild cognitive impairment 11/03/2016  . Anxiety 09/06/2016  . Abnormal brain MRI 09/06/2016  . Chronic cough 09/06/2016    Past Surgical History:  Procedure Laterality Date  . KNEE SURGERY Right   . TONSILLECTOMY         Family History  Problem Relation Age of Onset  . Diabetes Mother   . Appendicitis Father     Social History   Tobacco Use  . Smoking status: Former Research scientist (life sciences)  . Smokeless tobacco: Never Used  Substance Use Topics  . Alcohol use: Yes    Comment: Occasional beer  . Drug use: No    Home Medications Prior to Admission  medications   Medication Sig Start Date End Date Taking? Authorizing Provider  amLODipine (NORVASC) 10 MG tablet Take 1 tablet (10 mg total) by mouth at bedtime. 03/28/19   Kathrene Alu, MD  atorvastatin (LIPITOR) 20 MG tablet Take 20 mg by mouth at bedtime.    [provider]  donepezil (ARICEPT) 10 MG tablet Take 1 tablet (10 mg total) by mouth at bedtime. Patient taking differently: Take 10 mg by mouth daily.  03/30/18   Marcial Pacas, MD  losartan (COZAAR) 100 MG tablet Take 1 tablet (100 mg total) by mouth daily. 06/11/19   Orpah Greek, MD  memantine (NAMENDA) 10 MG tablet Take 1 tablet (10 mg total) by mouth 2 (two) times daily. 03/30/18   Marcial Pacas, MD  metoprolol succinate (TOPROL-XL) 25 MG 24 hr tablet Take 25 mg by mouth daily.    [provider]  omeprazole (PRILOSEC OTC) 20 MG tablet Take 20 mg by mouth daily before breakfast.    [provider]  tamsulosin (FLOMAX) 0.4 MG CAPS capsule Take 0.4 mg by mouth daily.    [provider]  triamcinolone cream (KENALOG) 0.1 % Apply 1 application topically daily as needed (for skin condition).     [provider]    Allergies    Patient has no known allergies.  Review of Systems   Review of Systems  Unable to perform ROS: Dementia  Constitutional: Negative for fever.  Respiratory: Negative for shortness of breath.   Cardiovascular: Negative for chest pain.  Gastrointestinal: Negative for abdominal pain, diarrhea and vomiting.  Genitourinary: Positive for difficulty urinating, dysuria and hesitancy.    Physical Exam Updated Vital Signs BP (!) 169/77 (BP Location: Left Arm)   Pulse 74   Temp 97.8 F (36.6 C) (Oral)   Resp 16   SpO2 99%   Physical Exam Vitals and nursing note reviewed.  Constitutional:      General: He is not in acute distress.    Appearance: Normal appearance.  HENT:     Head: Normocephalic and atraumatic.     Nose: Nose normal.  Eyes:      Conjunctiva/sclera: Conjunctivae normal.     Pupils: Pupils are equal, round, and reactive to light.  Cardiovascular:     Rate and Rhythm: Normal rate and regular rhythm.     Pulses: Normal pulses.     Heart sounds: Normal heart sounds.  Pulmonary:     Effort: Pulmonary effort is normal.     Breath sounds: Normal breath sounds.  Abdominal:     General: Abdomen is flat. Bowel sounds are normal.     Palpations: Abdomen is soft.     Tenderness: There is no abdominal tenderness. There is no guarding.  Musculoskeletal:        General: Normal range of motion.     Cervical back: Normal range of motion and neck supple.  Skin:    General: Skin is warm and dry.     Capillary Refill: Capillary refill takes less than 2 seconds.  Neurological:     Mental Status: He is alert.     Deep Tendon Reflexes: Reflexes normal.  Psychiatric:        Mood and Affect: Mood normal.     ED Results / Procedures / Treatments   Labs (all labs ordered are listed, but only abnormal results are displayed) Results for orders placed or performed during the hospital encounter of 09/10/20  Urinalysis, Routine w reflex microscopic Urine, Clean Catch  Result Value Ref Range   Color, Urine YELLOW YELLOW   APPearance HAZY (A) CLEAR   Specific Gravity, Urine 1.009 1.005 - 1.030   pH 7.0 5.0 - 8.0   Glucose, UA NEGATIVE NEGATIVE mg/dL   Hgb urine dipstick SMALL (A) NEGATIVE   Bilirubin Urine NEGATIVE NEGATIVE   Ketones, ur NEGATIVE NEGATIVE mg/dL   Protein, ur NEGATIVE NEGATIVE mg/dL   Nitrite NEGATIVE NEGATIVE   Leukocytes,Ua LARGE (A) NEGATIVE   RBC / HPF 0-5 0 - 5 RBC/hpf   WBC, UA >50 (H) 0 - 5 WBC/hpf   Bacteria, UA NONE SEEN NONE SEEN   Squamous Epithelial / LPF 0-5 0 - 5  CBC with Differential/Platelet  Result Value Ref Range   WBC 8.8 4.0 - 10.5 K/uL   RBC 4.75 4.22 - 5.81 MIL/uL   Hemoglobin 14.2 13.0 - 17.0 g/dL   HCT 44.5 39 - 52 %   MCV 93.7 80.0 - 100.0 fL   MCH 29.9 26.0 - 34.0 pg   MCHC  31.9 30.0 - 36.0 g/dL   RDW 13.9 11.5 - 15.5 %   Platelets 199 150 - 400 K/uL   nRBC 0.0 0.0 - 0.2 %   Neutrophils Relative % 77 %   Neutro Abs 6.9 1.7 - 7.7 K/uL   Lymphocytes Relative 12 %   Lymphs Abs 1.0 0.7 - 4.0 K/uL   Monocytes Relative 8 %  Monocytes Absolute 0.7 0.1 - 1.0 K/uL   Eosinophils Relative 2 %   Eosinophils Absolute 0.2 0.0 - 0.5 K/uL   Basophils Relative 0 %   Basophils Absolute 0.0 0.0 - 0.1 K/uL   Immature Granulocytes 1 %   Abs Immature Granulocytes 0.04 0.00 - 0.07 K/uL  I-stat chem 8, ED (not at [facility] or [facility])  Result Value Ref Range   Sodium 142 135 - 145 mmol/L   Potassium 4.0 3.5 - 5.1 mmol/L   Chloride 104 98 - 111 mmol/L   BUN 23 8 - 23 mg/dL   Creatinine, Ser 1.20 0.61 - 1.24 mg/dL   Glucose, Bld 145 (H) 70 - 99 mg/dL   Calcium, Ion 1.18 1.15 - 1.40 mmol/L   TCO2 26 22 - 32 mmol/L   Hemoglobin 14.6 13.0 - 17.0 g/dL   HCT 43.0 39 - 52 %   No results found.  Radiology No results found.  Procedures Procedures (including critical care time)  Medications Ordered in ED Medications  tamsulosin (FLOMAX) capsule 0.4 mg (0.4 mg Oral Given 09/10/20 0414)  cefTRIAXone (ROCEPHIN) injection 1 g (1 g Intramuscular Given 09/10/20 0413)    ED Course  I have reviewed the triage vital signs and the nursing notes.  Pertinent labs & imaging results that were available during my care of the patient were reviewed by me and considered in my medical decision making (see chart for details).   Patient is very well appearing.  Walking the halls.  No signs of sepsis on exam, vitals nor labs.  Treated in the ED.  Urine cultured and antibiotics initiated.  Follow up with your pMD this week for recheck and urology as an outpatient.  There is no urinary retention post void on bladder scan.  Strict return precautions given.      Andres Murray was evaluated in Emergency Department on 09/10/2020 for the symptoms described in the history of present illness. He was evaluated  in the context of the global COVID-19 pandemic, which necessitated consideration that the patient might be at risk for infection with the SARS-CoV-2 virus that causes COVID-19. Institutional protocols and algorithms that pertain to the evaluation of patients at risk for COVID-19 are in a state of rapid change based on information released by regulatory bodies including the CDC and federal and state organizations. These policies and algorithms were followed during the patient's care in the ED.  Final Clinical Impression(s) / ED Diagnoses  Return for intractable cough, coughing up blood,fevers >100.4 unrelieved by medication, shortness of breath, intractable vomiting, chest pain, shortness of breath, weakness,numbness, changes in speech, facial asymmetry,abdominal pain, passing out,Inability to tolerate liquids or food, cough, altered mental status or any concerns. No signs of systemic illness or infection. The patient is nontoxic-appearing on exam and vital signs are within normal limits.   I have reviewed the triage vital signs and the nursing notes. Pertinent labs &imaging results that were available during my care of the patient were reviewed by me and considered in my medical decision making (see chart for details).After history, exam, and medical workup I feel the patient has beenappropriately medically screened and is safe for discharge home. Pertinent diagnoses were discussed with the patient. Patient was given return precautions.   Audrinna Sherman, MD 09/10/20 [phone]

## 2020-09-11 LAB — URINE CULTURE: Special Requests: NORMAL

## 2021-07-21 ENCOUNTER — Encounter (HOSPITAL_COMMUNITY): Payer: Self-pay | Admitting: Emergency Medicine

## 2021-07-21 ENCOUNTER — Emergency Department (HOSPITAL_COMMUNITY): Payer: Medicare Other

## 2021-07-21 ENCOUNTER — Inpatient Hospital Stay (HOSPITAL_COMMUNITY)
Admission: EM | Admit: 2021-07-21 | Discharge: 2021-07-23 | DRG: 872 | Disposition: A | Discharge status: Hospice | Payer: Medicare Other | Attending: Internal Medicine | Admitting: Internal Medicine

## 2021-07-21 DIAGNOSIS — R945 Abnormal results of liver function studies: Secondary | ICD-10-CM | POA: Diagnosis present

## 2021-07-21 DIAGNOSIS — E785 Hyperlipidemia, unspecified: Secondary | ICD-10-CM | POA: Diagnosis present

## 2021-07-21 DIAGNOSIS — Z79899 Other long term (current) drug therapy: Secondary | ICD-10-CM

## 2021-07-21 DIAGNOSIS — N179 Acute kidney failure, unspecified: Secondary | ICD-10-CM | POA: Diagnosis present

## 2021-07-21 DIAGNOSIS — Z6822 Body mass index (BMI) 22.0-22.9, adult: Secondary | ICD-10-CM

## 2021-07-21 DIAGNOSIS — R4182 Altered mental status, unspecified: Secondary | ICD-10-CM | POA: Diagnosis present

## 2021-07-21 DIAGNOSIS — Z923 Personal history of irradiation: Secondary | ICD-10-CM

## 2021-07-21 DIAGNOSIS — D696 Thrombocytopenia, unspecified: Secondary | ICD-10-CM | POA: Diagnosis present

## 2021-07-21 DIAGNOSIS — Z66 Do not resuscitate: Secondary | ICD-10-CM | POA: Diagnosis present

## 2021-07-21 DIAGNOSIS — E778 Other disorders of glycoprotein metabolism: Secondary | ICD-10-CM | POA: Diagnosis present

## 2021-07-21 DIAGNOSIS — N200 Calculus of kidney: Secondary | ICD-10-CM | POA: Diagnosis present

## 2021-07-21 DIAGNOSIS — F419 Anxiety disorder, unspecified: Secondary | ICD-10-CM | POA: Diagnosis present

## 2021-07-21 DIAGNOSIS — Z515 Encounter for palliative care: Secondary | ICD-10-CM

## 2021-07-21 DIAGNOSIS — Z7952 Long term (current) use of systemic steroids: Secondary | ICD-10-CM

## 2021-07-21 DIAGNOSIS — Z8546 Personal history of malignant neoplasm of prostate: Secondary | ICD-10-CM

## 2021-07-21 DIAGNOSIS — F0390 Unspecified dementia without behavioral disturbance: Secondary | ICD-10-CM | POA: Diagnosis present

## 2021-07-21 DIAGNOSIS — Z20822 Contact with and (suspected) exposure to covid-19: Secondary | ICD-10-CM | POA: Diagnosis present

## 2021-07-21 DIAGNOSIS — R6520 Severe sepsis without septic shock: Secondary | ICD-10-CM | POA: Diagnosis present

## 2021-07-21 DIAGNOSIS — R531 Weakness: Secondary | ICD-10-CM

## 2021-07-21 DIAGNOSIS — I119 Hypertensive heart disease without heart failure: Secondary | ICD-10-CM | POA: Diagnosis present

## 2021-07-21 DIAGNOSIS — D72810 Lymphocytopenia: Secondary | ICD-10-CM | POA: Diagnosis present

## 2021-07-21 DIAGNOSIS — R932 Abnormal findings on diagnostic imaging of liver and biliary tract: Secondary | ICD-10-CM | POA: Diagnosis present

## 2021-07-21 DIAGNOSIS — C801 Malignant (primary) neoplasm, unspecified: Secondary | ICD-10-CM | POA: Diagnosis present

## 2021-07-21 DIAGNOSIS — C787 Secondary malignant neoplasm of liver and intrahepatic bile duct: Secondary | ICD-10-CM | POA: Diagnosis present

## 2021-07-21 DIAGNOSIS — A419 Sepsis, unspecified organism: Principal | ICD-10-CM | POA: Diagnosis present

## 2021-07-21 DIAGNOSIS — Z833 Family history of diabetes mellitus: Secondary | ICD-10-CM

## 2021-07-21 DIAGNOSIS — R2689 Other abnormalities of gait and mobility: Secondary | ICD-10-CM | POA: Diagnosis present

## 2021-07-21 DIAGNOSIS — R7989 Other specified abnormal findings of blood chemistry: Secondary | ICD-10-CM | POA: Diagnosis present

## 2021-07-21 DIAGNOSIS — I700 Atherosclerosis of aorta: Secondary | ICD-10-CM | POA: Diagnosis present

## 2021-07-21 DIAGNOSIS — G3184 Mild cognitive impairment, so stated: Secondary | ICD-10-CM | POA: Diagnosis present

## 2021-07-21 DIAGNOSIS — Z87891 Personal history of nicotine dependence: Secondary | ICD-10-CM

## 2021-07-21 DIAGNOSIS — K5790 Diverticulosis of intestine, part unspecified, without perforation or abscess without bleeding: Secondary | ICD-10-CM | POA: Diagnosis present

## 2021-07-21 DIAGNOSIS — R627 Adult failure to thrive: Secondary | ICD-10-CM | POA: Diagnosis present

## 2021-07-21 LAB — COMPREHENSIVE METABOLIC PANEL
ALT: 304 U/L — ABNORMAL HIGH (ref 0–44)
ALT: 287 U/L — ABNORMAL HIGH (ref 0–44)
AST: 247 U/L — ABNORMAL HIGH (ref 15–41)
AST: 232 U/L — ABNORMAL HIGH (ref 15–41)
Albumin: 2.5 g/dL — ABNORMAL LOW (ref 3.5–5.0)
Albumin: 2.3 g/dL — ABNORMAL LOW (ref 3.5–5.0)
Alkaline Phosphatase: 127 U/L — ABNORMAL HIGH (ref 38–126)
Alkaline Phosphatase: 129 U/L — ABNORMAL HIGH (ref 38–126)
Anion gap: 11 (ref 5–15)
Anion gap: 14 (ref 5–15)
BUN: 60 mg/dL — ABNORMAL HIGH (ref 8–23)
BUN: 61 mg/dL — ABNORMAL HIGH (ref 8–23)
CO2: 23 mmol/L (ref 22–32)
CO2: 19 mmol/L — ABNORMAL LOW (ref 22–32)
Calcium: 9.3 mg/dL (ref 8.9–10.3)
Calcium: 8.4 mg/dL — ABNORMAL LOW (ref 8.9–10.3)
Chloride: 104 mmol/L (ref 98–111)
Chloride: 105 mmol/L (ref 98–111)
Creatinine, Ser: 2.24 mg/dL — ABNORMAL HIGH (ref 0.61–1.24)
Creatinine, Ser: 2.04 mg/dL — ABNORMAL HIGH (ref 0.61–1.24)
GFR, Estimated: 28 mL/min — ABNORMAL LOW (ref >60)
GFR, Estimated: 31 mL/min — ABNORMAL LOW (ref >60)
Glucose, Bld: 142 mg/dL — ABNORMAL HIGH (ref 70–99)
Glucose, Bld: 113 mg/dL — ABNORMAL HIGH (ref 70–99)
Potassium: 3.4 mmol/L — ABNORMAL LOW (ref 3.5–5.1)
Potassium: 3.5 mmol/L (ref 3.5–5.1)
Sodium: 138 mmol/L (ref 135–145)
Sodium: 138 mmol/L (ref 135–145)
Total Bilirubin: 1.5 mg/dL — ABNORMAL HIGH (ref 0.3–1.2)
Total Bilirubin: 1.4 mg/dL — ABNORMAL HIGH (ref 0.3–1.2)
Total Protein: 5.9 g/dL — ABNORMAL LOW (ref 6.5–8.1)
Total Protein: 5.5 g/dL — ABNORMAL LOW (ref 6.5–8.1)

## 2021-07-21 LAB — I-STAT ARTERIAL BLOOD GAS, ED
Acid-base deficit: 1 mmol/L (ref 0.0–2.0)
Bicarbonate: 20.9 mmol/L (ref 20.0–28.0)
Calcium, Ion: 1.21 mmol/L (ref 1.15–1.40)
HCT: 38 % — ABNORMAL LOW (ref 39.0–52.0)
Hemoglobin: 12.9 g/dL — ABNORMAL LOW (ref 13.0–17.0)
O2 Saturation: 94 %
Patient temperature: 100.9
Potassium: 3.2 mmol/L — ABNORMAL LOW (ref 3.5–5.1)
Sodium: 141 mmol/L (ref 135–145)
TCO2: 22 mmol/L (ref 22–32)
pCO2 arterial: 29.2 mmHg — ABNORMAL LOW (ref 32.0–48.0)
pH, Arterial: 7.468 — ABNORMAL HIGH (ref 7.350–7.450)
pO2, Arterial: 70 mmHg — ABNORMAL LOW (ref 83.0–108.0)

## 2021-07-21 LAB — PROTIME-INR
INR: 1.2 (ref 0.8–1.2)
INR: 1.2 (ref 0.8–1.2)
Prothrombin Time: 14.8 seconds (ref 11.4–15.2)
Prothrombin Time: 15.6 seconds — ABNORMAL HIGH (ref 11.4–15.2)

## 2021-07-21 LAB — CBC WITH DIFFERENTIAL/PLATELET
Abs Immature Granulocytes: 0.31 10*3/uL — ABNORMAL HIGH (ref 0.00–0.07)
Abs Immature Granulocytes: 0.04 10*3/uL (ref 0.00–0.07)
Basophils Absolute: 0.1 10*3/uL (ref 0.0–0.1)
Basophils Absolute: 0.1 10*3/uL (ref 0.0–0.1)
Basophils Relative: 1 %
Basophils Relative: 1 %
Eosinophils Absolute: 0.1 10*3/uL (ref 0.0–0.5)
Eosinophils Absolute: 0 10*3/uL (ref 0.0–0.5)
Eosinophils Relative: 0 %
Eosinophils Relative: 0 %
HCT: 41.9 % (ref 39.0–52.0)
HCT: 40.8 % (ref 39.0–52.0)
Hemoglobin: 13.7 g/dL (ref 13.0–17.0)
Hemoglobin: 13.2 g/dL (ref 13.0–17.0)
Immature Granulocytes: 2 %
Immature Granulocytes: 1 %
Lymphocytes Relative: 3 %
Lymphocytes Relative: 4 %
Lymphs Abs: 0.5 10*3/uL — ABNORMAL LOW (ref 0.7–4.0)
Lymphs Abs: 0.2 10*3/uL — ABNORMAL LOW (ref 0.7–4.0)
MCH: 30.5 pg (ref 26.0–34.0)
MCH: 30.6 pg (ref 26.0–34.0)
MCHC: 32.7 g/dL (ref 30.0–36.0)
MCHC: 32.4 g/dL (ref 30.0–36.0)
MCV: 93.3 fL (ref 80.0–100.0)
MCV: 94.4 fL (ref 80.0–100.0)
Monocytes Absolute: 1 10*3/uL (ref 0.1–1.0)
Monocytes Absolute: 0.1 10*3/uL (ref 0.1–1.0)
Monocytes Relative: 6 %
Monocytes Relative: 1 %
Neutro Abs: 15 10*3/uL — ABNORMAL HIGH (ref 1.7–7.7)
Neutro Abs: 5.2 10*3/uL (ref 1.7–7.7)
Neutrophils Relative %: 88 %
Neutrophils Relative %: 93 %
Platelets: 69 10*3/uL — ABNORMAL LOW (ref 150–400)
Platelets: 52 10*3/uL — ABNORMAL LOW (ref 150–400)
RBC: 4.49 MIL/uL (ref 4.22–5.81)
RBC: 4.32 MIL/uL (ref 4.22–5.81)
RDW: 15.1 % (ref 11.5–15.5)
RDW: 15 % (ref 11.5–15.5)
Smear Review: DECREASED
WBC: 17 10*3/uL — ABNORMAL HIGH (ref 4.0–10.5)
WBC: 5.6 10*3/uL (ref 4.0–10.5)
nRBC: 0 % (ref 0.0–0.2)
nRBC: 0 % (ref 0.0–0.2)

## 2021-07-21 LAB — APTT
aPTT: 30 seconds (ref 24–36)
aPTT: 28 seconds (ref 24–36)

## 2021-07-21 LAB — RESP PANEL BY RT-PCR (FLU A&B, COVID) ARPGX2
Influenza A by PCR: NEGATIVE
Influenza B by PCR: NEGATIVE
SARS Coronavirus 2 by RT PCR: NEGATIVE

## 2021-07-21 LAB — LACTIC ACID, PLASMA: Lactic Acid, Venous: 5.7 mmol/L (ref 0.5–1.9)

## 2021-07-21 MED ORDER — LORAZEPAM 2 MG/ML IJ SOLN
0.5000 mg | Freq: Once | INTRAMUSCULAR | Status: AC
  Administered 2021-07-21: 0.5 mg via INTRAVENOUS
  Filled 2021-07-21: qty 1

## 2021-07-21 MED ORDER — SODIUM CHLORIDE 0.9 % IV BOLUS
1000.0000 mL | Freq: Once | INTRAVENOUS | Status: AC
  Administered 2021-07-21: 1000 mL via INTRAVENOUS

## 2021-07-21 MED ORDER — SODIUM CHLORIDE 0.9 % IV BOLUS (SEPSIS)
1000.0000 mL | Freq: Once | INTRAVENOUS | Status: AC
  Administered 2021-07-21: 1000 mL via INTRAVENOUS

## 2021-07-21 MED ORDER — METRONIDAZOLE 500 MG/100ML IV SOLN
500.0000 mg | Freq: Once | INTRAVENOUS | Status: AC
  Administered 2021-07-21: 500 mg via INTRAVENOUS
  Filled 2021-07-21: qty 100

## 2021-07-21 MED ORDER — ACETAMINOPHEN 650 MG RE SUPP
650.0000 mg | Freq: Once | RECTAL | Status: AC
  Administered 2021-07-22: 650 mg via RECTAL
  Filled 2021-07-21: qty 1

## 2021-07-21 MED ORDER — VANCOMYCIN HCL 1250 MG/250ML IV SOLN
1250.0000 mg | Freq: Once | INTRAVENOUS | Status: AC
  Administered 2021-07-22: 1250 mg via INTRAVENOUS
  Filled 2021-07-21: qty 250

## 2021-07-21 MED ORDER — SODIUM CHLORIDE 0.9 % IV SOLN
2.0000 g | INTRAVENOUS | Status: DC
  Administered 2021-07-21: 2 g via INTRAVENOUS
  Filled 2021-07-21: qty 2

## 2021-07-21 MED ORDER — SODIUM CHLORIDE 0.9 % IV BOLUS (SEPSIS)
1000.0000 mL | Freq: Once | INTRAVENOUS | Status: AC
  Administered 2021-07-22: 1000 mL via INTRAVENOUS

## 2021-07-21 MED ORDER — SODIUM CHLORIDE 0.9 % IV SOLN: 2.0000 g | Freq: Once | INTRAVENOUS | Status: DC

## 2021-07-21 MED ORDER — ACETAMINOPHEN 325 MG PO TABS: 650.0000 mg | ORAL_TABLET | Freq: Once | ORAL | Status: DC

## 2021-07-21 MED ORDER — VANCOMYCIN VARIABLE DOSE PER UNSTABLE RENAL FUNCTION (PHARMACIST DOSING): Status: DC

## 2021-07-21 NOTE — ED Provider Notes (Signed)
[place] EMERGENCY DEPARTMENT Provider Note   CSN: [id number] Arrival date & time: 07/21/21  1416     History Chief Complaint  Patient presents with  . Extremity Weakness    Andres Murray is a 87 y.o. male who presents emergency department chief complaint of bilateral leg weakness.  Patient states that he was doing fine up until 3 days ago when he began to be too weak to hold himself up.  He says that his son's been having to help him.  He states that today it is better his leg weakness is better.  He denies unilateral weakness, difficulty with speech or swallowing.  He is persistently clearing his lungs but denies cough or shortness of breath.  He has a past medical history of hypertension and hyperlipidemia.  He denies fevers, chills, nausea, vomiting or recent illness.   Extremity Weakness      Past Medical History:  Diagnosis Date  . Anxiety   . Hyperlipemia   . Hypertension   . Persistent dry cough     Patient Active Problem List   Diagnosis Date Noted  . Hypertensive emergency without congestive heart failure 03/27/2019  . Hypertensive urgency 03/27/2019  . Hypertension 03/27/2019  . Gait abnormality 03/06/2019  . Dementia ([place]) 09/26/2017  . Paresthesia 03/24/2017  . Mild cognitive impairment 11/03/2016  . Anxiety 09/06/2016  . Abnormal brain MRI 09/06/2016  . Chronic cough 09/06/2016    Past Surgical History:  Procedure Laterality Date  . KNEE SURGERY Right   . TONSILLECTOMY         Family History  Problem Relation Age of Onset  . Diabetes Mother   . Appendicitis Father     Social History   Tobacco Use  . Smoking status: Former  . Smokeless tobacco: Never  Substance Use Topics  . Alcohol use: Yes    Comment: Occasional beer  . Drug use: No    Home Medications Prior to Admission medications   Medication Sig Start Date End Date Taking? Authorizing Provider  amLODipine (NORVASC) 10 MG tablet Take 1 tablet (10 mg total) by  mouth at bedtime. 03/28/19  Yes Winfrey, Alcario Drought, MD  atorvastatin (LIPITOR) 20 MG tablet Take 20 mg by mouth at bedtime.   Yes [provider]  donepezil (ARICEPT) 10 MG tablet Take 1 tablet (10 mg total) by mouth at bedtime. Patient taking differently: Take 10 mg by mouth daily. 03/30/18  Yes Marcial Pacas, MD  losartan (COZAAR) 100 MG tablet Take 1 tablet (100 mg total) by mouth daily. 06/11/19  Yes Pollina, Gwenyth Allegra, MD  memantine (NAMENDA) 10 MG tablet Take 1 tablet (10 mg total) by mouth 2 (two) times daily. 03/30/18  Yes Marcial Pacas, MD  metoprolol succinate (TOPROL-XL) 25 MG 24 hr tablet Take 25 mg by mouth daily.   Yes [provider]  mirtazapine (REMERON) 15 MG tablet Take 15 mg by mouth at bedtime. 07/11/21  Yes [provider]  omeprazole (PRILOSEC OTC) 20 MG tablet Take 20 mg by mouth every morning.   Yes [provider]  predniSONE (DELTASONE) 10 MG tablet Take 10 mg by mouth See admin instructions. TAKE 4 TABS EVERY MORNING X4 DAYS, 3 TABS X4 DAYS, 2 TABS X4 DAYS, 1 TAB X4 DAYS 07/08/21  Yes [provider]  tamsulosin (FLOMAX) 0.4 MG CAPS capsule Take 1 capsule (0.4 mg total) by mouth daily. 09/10/20  Yes Palumbo, April, MD  cephALEXin (KEFLEX) 500 MG capsule Take 1 capsule (500  mg total) by mouth 4 (four) times daily. Patient not taking: No sig reported 09/10/20   Palumbo, April, MD  tamsulosin (FLOMAX) 0.4 MG CAPS capsule Take 0.4 mg by mouth daily. Patient not taking: Reported on 07/21/2021    [provider]  triamcinolone cream (KENALOG) 0.1 % Apply 1 application topically daily as needed (for skin condition).  Patient not taking: Reported on 07/21/2021    [provider]    Allergies    Patient has no known allergies.  Review of Systems   Review of Systems  Musculoskeletal:  Positive for extremity weakness.  Ten systems reviewed and are negative for acute change, except as noted in the HPI.   Physical  Exam Updated Vital Signs BP 104/63   Pulse 82   Temp 98.6 F (37 C) (Oral)   Resp (!) 25   SpO2 97%   Physical Exam Vitals and nursing note reviewed.  Constitutional:      General: He is not in acute distress.    Appearance: He is well-developed. He is not diaphoretic.  HENT:     Head: Normocephalic and atraumatic.  Eyes:     General: No scleral icterus.    Conjunctiva/sclera: Conjunctivae normal.  Cardiovascular:     Rate and Rhythm: Normal rate and regular rhythm.     Heart sounds: Normal heart sounds.  Pulmonary:     Effort: Pulmonary effort is normal. No respiratory distress.     Breath sounds: Normal breath sounds.  Abdominal:     Palpations: Abdomen is soft.     Tenderness: There is no abdominal tenderness.  Musculoskeletal:     Cervical back: Normal range of motion and neck supple.  Skin:    General: Skin is warm and dry.  Neurological:     Mental Status: He is alert.     Comments: Speech is clear and goal oriented, follows commands Major Cranial nerves without deficit, no facial droop Normal strength in upper and lower extremities bilaterally including dorsiflexion and plantar flexion, strong and equal grip strength Sensation normal to light and sharp touch Moves extremities without ataxia, coordination intact Normal finger to nose and rapid alternating movements Dilatory at bedside without assistance   Psychiatric:        Behavior: Behavior normal.    ED Results / Procedures / Treatments   Labs (all labs ordered are listed, but only abnormal results are displayed) Labs Reviewed  COMPREHENSIVE METABOLIC PANEL - Abnormal; Notable for the following components:      Result Value   Potassium 3.4 (*)    Glucose, Bld 142 (*)    BUN 60 (*)    Creatinine, Ser 2.24 (*)    Total Protein 5.9 (*)    Albumin 2.5 (*)    AST 247 (*)    ALT 304 (*)    Alkaline Phosphatase 127 (*)    Total Bilirubin 1.5 (*)    GFR, Estimated 28 (*)    All other components within  normal limits  CBC WITH DIFFERENTIAL/PLATELET - Abnormal; Notable for the following components:   WBC 17.0 (*)    Platelets 69 (*)    Neutro Abs 15.0 (*)    Lymphs Abs 0.5 (*)    Abs Immature Granulocytes 0.31 (*)    All other components within normal limits  URINE CULTURE  PROTIME-INR  APTT  URINALYSIS, ROUTINE W REFLEX MICROSCOPIC    EKG None  Radiology CT HEAD WO CONTRAST (5MM)  Result Date: 07/21/2021 CLINICAL DATA:  Neuro  deficit, stroke suspected EXAM: CT HEAD WITHOUT CONTRAST TECHNIQUE: Contiguous axial images were obtained from the base of the skull through the vertex without intravenous contrast. COMPARISON:  Brain MRI 10/11/2016, CT head 01/25/2019 FINDINGS: Brain: There is no acute intracranial hemorrhage, extra-axial fluid collection, or acute infarct. There is encephalomalacia in the left frontal and occipital lobes consistent with prior infarcts. The occipital infarct is new since 03/27/2019, while the frontal lobe infarct is unchanged. Confluent hypodensity throughout the remainder of the subcortical and periventricular white matter likely reflects advanced chronic white matter microangiopathy. There is moderate global parenchymal volume loss with commensurate enlargement of the ventricular system. A subcentimeter fat density lesion in the region of the quadrigeminal plate cistern is unchanged, likely a small lipoma. There is no new mass lesion. There is no midline shift. Vascular: There is calcification of the bilateral cavernous ICAs and vertebral arteries. Skull: Normal. Negative for fracture or focal lesion. Sinuses/Orbits: The imaged paranasal sinuses are clear. The imaged globes and orbits are unremarkable. Other: The mastoid air cells are clear. IMPRESSION: 1. No acute intracranial pathology. 2. Global parenchymal volume loss and advanced chronic white matter microangiopathy. 3. 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 Electronically Signed   By: Valetta Mole  M.D.   On: 07/21/2021 16:18    Procedures Procedures   Medications Ordered in ED Medications - No data to display  ED Course  I have reviewed the triage vital signs and the nursing notes.  Pertinent labs & imaging results that were available during my care of the patient were reviewed by me and considered in my medical decision making (see chart for details).  Clinical Course as of 07/21/21 2249  Tue Jul 21, 2021  2248 CBC WITH DIFFERENTIAL(!) [AH]    Clinical Course User Index [AH] Margarita Mail, PA-C   MDM Rules/Calculators/A&P                           [id number] VS:  Vitals:   07/21/21 1426 07/21/21 1600 07/21/21 1811  BP: (!) 87/55 110/69 104/63  Pulse: (!) 115 95 82  Resp: 16 18 (!) 25  Temp: 98.6 F (37 C)    TempSrc: Oral    SpO2: 97% 96% 97%    [id number] is gathered by patient and son at bedside. Previous records obtained and reviewed. DDX:The patient's complaint of weakness involves an extensive number of diagnostic and treatment options, and is a complaint that carries with it a high risk of complications, morbidity, and potential mortality. Given the large differential diagnosis, medical decision making is of high complexity. The differential diagnosis of weakness includes but is not limited to neurologic causes (GBS, myasthenia gravis, CVA, MS, ALS, transverse myelitis, spinal cord injury, CVA, botulism, ) and other causes: ACS, Arrhythmia, syncope, orthostatic hypotension, sepsis, hypoglycemia, electrolyte disturbance, hypothyroidism, respiratory failure, symptomatic anemia, dehydration, heat injury, polypharmacy, malignancy.  Labs: I ordered reviewed and interpreted labs which include CBC with elevated white blood cell count CMP with mild hypokalemia, BUN and creatinine elevated, creatinine is up to 2.24 from previous value of 1.210 months ago and liver enzymes are markedly elevated with high total bilirubin, low protein values. COVID test is  pending Urine is pending Coag studies within normal limits Imaging: I ordered and reviewed images which included CT head, portable 1 view chest x-ray. I independently visualized and interpreted all imaging.  CT head shows sequela of old infarcts, no recent or acute infarcts.  Portable  1 view chest x-ray shows no infiltrates or edema, no acute findings.  CT abdomen pelvis currently pending EKG: Sinus tachycardia at a rate of 112.now  Consults: Dr. Jennette Kettle MDM: This is an 87 year old male who comes in with complaint of weakness.  Patient has significantly abnormal lab findings including elevated white blood cell count count, thrombocytopenia, lymphopenia pia, CMP shows acute kidney injury, markedly elevated liver enzymes, hypoproteinemia and elevated bilirubin of unknown significance.  This could be hepatorenal syndrome, TTP less likely, patient may have an underlying malignancy or infection.  His CT scan is currently pending Patient disposition:The patient appears reasonably stabilized for admission considering the current resources, flow, and capabilities available in the ED at this time, and I doubt any other [facility] requiring further screening and/or treatment in the ED prior to admission.       Final Clinical Impression(s) / ED Diagnoses Final diagnoses:  None    Rx / DC Orders ED Discharge Orders     None        Margarita Mail, PA-C 07/21/21 2108    Pattricia Boss, MD 07/23/21 [phone]

## 2021-07-21 NOTE — ED Triage Notes (Signed)
Patient here for evaluation of bilateral lower extremity weakness that started three days ago. Denies weakness in arms, denies numbness, no facial droop, no arm drift.

## 2021-07-21 NOTE — ED Notes (Signed)
PA made notified of:  - lactic acid 5.7  - HR in the 120s  -O2 dropped to 87%, pt placed on 2 L.

## 2021-07-21 NOTE — Progress Notes (Signed)
Pharmacy Antibiotic Note  Andres Murray is a 87 y.o. male admitted on 07/21/2021 with sepsis.  Pharmacy has been consulted for vancomycin and cefepime dosing.  Patient's serum creatinine is 2.24 which is significantly above baseline.  Metronidazole once ordered to be given in the ED  Plan: Cefepime 2g q24h Vancomycin 1250 mg once, subsequent dosing as indicated per random vancomycin level until renal function stable and/or improved, at which time scheduled dosing can be considered Follow-up cultures and de-escalate antibiotics as appropriate.     Temp (24hrs), Avg:99.5 F (37.5 C), Min:98.6 F (37 C), Max:100.4 F (38 C)  Recent Labs  Lab 07/21/21 1519 07/21/21 2230  WBC 17.0* 5.6  CREATININE 2.24*  --     CrCl cannot be calculated (Unknown ideal weight.).    No Known Allergies  Antimicrobials this admission: cefepime 9/6 >>  vancomycin 9/6 >>  metronidazole 9/6 >>   Microbiology results: Pending  Thank you for allowing pharmacy to be a part of this patient's care.  Lorelei Pont, PharmD, BCPS 07/21/2021 10:47 PM ED Clinical Pharmacist -  [phone]

## 2021-07-21 NOTE — ED Provider Notes (Signed)
Emergency Medicine Provider Triage Evaluation Note  Andres Murray , a 87 y.o. male  was evaluated in triage.  Pt complains of weakness to bilateral lower extremities x3 days.  Patient denies any numbness, weakness upper extremities, facial asymmetry, slurred speech, visual disturbance.  Distally patient complains of dysuria.  Review of Systems  Positive: Bilateral lower extremity weakness, dysuria Negative: numbness, weakness upper extremities, facial asymmetry, slurred speech, visual disturbance, urinary frequency, hematuria, abdominal pain, nausea, vomiting, fevers, chills  Physical Exam  BP (!) 87/55 (BP Location: Right Arm)   Pulse (!) 115   Temp 98.6 F (37 C) (Oral)   Resp 16   SpO2 97%  Gen:   Awake, no distress   Resp:  Normal effort  MSK:   Moves extremities without difficulty  Other:  Patient able to move all limbs equally.  Grip strength equal.  Negative pronator drift.  Sensation to light touch intact to bilateral upper and lower extremities.  No facial asymmetry.  EOM intact.  Abdomen soft, nondistended, nontender, no peritoneal signs.  Medical Decision Making  Medically screening exam initiated at 2:40 PM.  Appropriate orders placed.  Clinten House was informed that the remainder of the evaluation will be completed by another provider, this initial triage assessment does not replace that evaluation, and the importance of remaining in the ED until their evaluation is complete.  The patient appears stable so that the remainder of the work up may be completed by another provider.      Loni Beckwith, PA-C 07/21/21 1442    Daleen Bo, MD 07/21/21 [phone]

## 2021-07-22 ENCOUNTER — Other Ambulatory Visit: Payer: Self-pay

## 2021-07-22 ENCOUNTER — Emergency Department (HOSPITAL_COMMUNITY): Payer: Medicare Other

## 2021-07-22 DIAGNOSIS — Z8546 Personal history of malignant neoplasm of prostate: Secondary | ICD-10-CM | POA: Diagnosis not present

## 2021-07-22 DIAGNOSIS — F0390 Unspecified dementia without behavioral disturbance: Secondary | ICD-10-CM | POA: Diagnosis present

## 2021-07-22 DIAGNOSIS — C801 Malignant (primary) neoplasm, unspecified: Secondary | ICD-10-CM | POA: Diagnosis present

## 2021-07-22 DIAGNOSIS — I700 Atherosclerosis of aorta: Secondary | ICD-10-CM | POA: Diagnosis present

## 2021-07-22 DIAGNOSIS — N179 Acute kidney failure, unspecified: Secondary | ICD-10-CM | POA: Diagnosis present

## 2021-07-22 DIAGNOSIS — C787 Secondary malignant neoplasm of liver and intrahepatic bile duct: Secondary | ICD-10-CM | POA: Diagnosis present

## 2021-07-22 DIAGNOSIS — E785 Hyperlipidemia, unspecified: Secondary | ICD-10-CM | POA: Diagnosis present

## 2021-07-22 DIAGNOSIS — Z66 Do not resuscitate: Secondary | ICD-10-CM | POA: Diagnosis present

## 2021-07-22 DIAGNOSIS — D696 Thrombocytopenia, unspecified: Secondary | ICD-10-CM | POA: Diagnosis present

## 2021-07-22 DIAGNOSIS — A419 Sepsis, unspecified organism: Secondary | ICD-10-CM | POA: Diagnosis present

## 2021-07-22 DIAGNOSIS — K5790 Diverticulosis of intestine, part unspecified, without perforation or abscess without bleeding: Secondary | ICD-10-CM | POA: Diagnosis present

## 2021-07-22 DIAGNOSIS — Z923 Personal history of irradiation: Secondary | ICD-10-CM | POA: Diagnosis not present

## 2021-07-22 DIAGNOSIS — R627 Adult failure to thrive: Secondary | ICD-10-CM | POA: Diagnosis present

## 2021-07-22 DIAGNOSIS — I119 Hypertensive heart disease without heart failure: Secondary | ICD-10-CM | POA: Diagnosis present

## 2021-07-22 DIAGNOSIS — F419 Anxiety disorder, unspecified: Secondary | ICD-10-CM | POA: Diagnosis present

## 2021-07-22 DIAGNOSIS — D72810 Lymphocytopenia: Secondary | ICD-10-CM | POA: Diagnosis present

## 2021-07-22 DIAGNOSIS — Z833 Family history of diabetes mellitus: Secondary | ICD-10-CM | POA: Diagnosis not present

## 2021-07-22 DIAGNOSIS — N200 Calculus of kidney: Secondary | ICD-10-CM | POA: Diagnosis present

## 2021-07-22 DIAGNOSIS — R4182 Altered mental status, unspecified: Secondary | ICD-10-CM | POA: Diagnosis present

## 2021-07-22 DIAGNOSIS — R531 Weakness: Secondary | ICD-10-CM | POA: Diagnosis not present

## 2021-07-22 DIAGNOSIS — Z515 Encounter for palliative care: Secondary | ICD-10-CM

## 2021-07-22 DIAGNOSIS — R2689 Other abnormalities of gait and mobility: Secondary | ICD-10-CM | POA: Diagnosis present

## 2021-07-22 DIAGNOSIS — R945 Abnormal results of liver function studies: Secondary | ICD-10-CM | POA: Diagnosis present

## 2021-07-22 DIAGNOSIS — R6520 Severe sepsis without septic shock: Secondary | ICD-10-CM

## 2021-07-22 DIAGNOSIS — R7989 Other specified abnormal findings of blood chemistry: Secondary | ICD-10-CM | POA: Diagnosis present

## 2021-07-22 DIAGNOSIS — Z87891 Personal history of nicotine dependence: Secondary | ICD-10-CM | POA: Diagnosis not present

## 2021-07-22 DIAGNOSIS — R932 Abnormal findings on diagnostic imaging of liver and biliary tract: Secondary | ICD-10-CM | POA: Diagnosis not present

## 2021-07-22 DIAGNOSIS — Z20822 Contact with and (suspected) exposure to covid-19: Secondary | ICD-10-CM | POA: Diagnosis present

## 2021-07-22 DIAGNOSIS — E778 Other disorders of glycoprotein metabolism: Secondary | ICD-10-CM | POA: Diagnosis present

## 2021-07-22 LAB — BASIC METABOLIC PANEL
Anion gap: 10 (ref 5–15)
Anion gap: 10 (ref 5–15)
BUN: 58 mg/dL — ABNORMAL HIGH (ref 8–23)
BUN: 57 mg/dL — ABNORMAL HIGH (ref 8–23)
CO2: 19 mmol/L — ABNORMAL LOW (ref 22–32)
CO2: 16 mmol/L — ABNORMAL LOW (ref 22–32)
Calcium: 8 mg/dL — ABNORMAL LOW (ref 8.9–10.3)
Calcium: 7.9 mg/dL — ABNORMAL LOW (ref 8.9–10.3)
Chloride: 110 mmol/L (ref 98–111)
Chloride: 113 mmol/L — ABNORMAL HIGH (ref 98–111)
Creatinine, Ser: 1.8 mg/dL — ABNORMAL HIGH (ref 0.61–1.24)
Creatinine, Ser: 1.74 mg/dL — ABNORMAL HIGH (ref 0.61–1.24)
GFR, Estimated: 36 mL/min — ABNORMAL LOW (ref >60)
GFR, Estimated: 38 mL/min — ABNORMAL LOW (ref >60)
Glucose, Bld: 126 mg/dL — ABNORMAL HIGH (ref 70–99)
Glucose, Bld: 195 mg/dL — ABNORMAL HIGH (ref 70–99)
Potassium: 3.1 mmol/L — ABNORMAL LOW (ref 3.5–5.1)
Potassium: 3.9 mmol/L (ref 3.5–5.1)
Sodium: 139 mmol/L (ref 135–145)
Sodium: 139 mmol/L (ref 135–145)

## 2021-07-22 LAB — CBC
HCT: 32.9 % — ABNORMAL LOW (ref 39.0–52.0)
HCT: 35.5 % — ABNORMAL LOW (ref 39.0–52.0)
Hemoglobin: 11.1 g/dL — ABNORMAL LOW (ref 13.0–17.0)
Hemoglobin: 12 g/dL — ABNORMAL LOW (ref 13.0–17.0)
MCH: 30.6 pg (ref 26.0–34.0)
MCH: 31 pg (ref 26.0–34.0)
MCHC: 33.7 g/dL (ref 30.0–36.0)
MCHC: 33.8 g/dL (ref 30.0–36.0)
MCV: 90.6 fL (ref 80.0–100.0)
MCV: 91.7 fL (ref 80.0–100.0)
Platelets: 39 10*3/uL — ABNORMAL LOW (ref 150–400)
Platelets: 37 10*3/uL — ABNORMAL LOW (ref 150–400)
RBC: 3.63 MIL/uL — ABNORMAL LOW (ref 4.22–5.81)
RBC: 3.87 MIL/uL — ABNORMAL LOW (ref 4.22–5.81)
RDW: 14.9 % (ref 11.5–15.5)
RDW: 15.3 % (ref 11.5–15.5)
WBC: 6.5 10*3/uL (ref 4.0–10.5)
WBC: 10.6 10*3/uL — ABNORMAL HIGH (ref 4.0–10.5)
nRBC: 0 % (ref 0.0–0.2)
nRBC: 0 % (ref 0.0–0.2)

## 2021-07-22 LAB — MAGNESIUM: Magnesium: 2.3 mg/dL (ref 1.7–2.4)

## 2021-07-22 LAB — POCT I-STAT 7, (LYTES, BLD GAS, ICA,H+H)
Acid-base deficit: 5 mmol/L — ABNORMAL HIGH (ref 0.0–2.0)
Bicarbonate: 18.1 mmol/L — ABNORMAL LOW (ref 20.0–28.0)
Calcium, Ion: 1.2 mmol/L (ref 1.15–1.40)
HCT: 32 % — ABNORMAL LOW (ref 39.0–52.0)
Hemoglobin: 10.9 g/dL — ABNORMAL LOW (ref 13.0–17.0)
O2 Saturation: 93 %
Patient temperature: 100.4
Potassium: 3 mmol/L — ABNORMAL LOW (ref 3.5–5.1)
Sodium: 142 mmol/L (ref 135–145)
TCO2: 19 mmol/L — ABNORMAL LOW (ref 22–32)
pCO2 arterial: 28.5 mmHg — ABNORMAL LOW (ref 32.0–48.0)
pH, Arterial: 7.416 (ref 7.350–7.450)
pO2, Arterial: 67 mmHg — ABNORMAL LOW (ref 83.0–108.0)

## 2021-07-22 LAB — LACTIC ACID, PLASMA
Lactic Acid, Venous: 3.4 mmol/L (ref 0.5–1.9)
Lactic Acid, Venous: 2.6 mmol/L (ref 0.5–1.9)

## 2021-07-22 LAB — CORTISOL: Cortisol, Plasma: 56 ug/dL

## 2021-07-22 LAB — DIC (DISSEMINATED INTRAVASCULAR COAGULATION)PANEL
D-Dimer, Quant: 9.71 ug/mL-FEU — ABNORMAL HIGH (ref 0.00–0.50)
Fibrinogen: 721 mg/dL — ABNORMAL HIGH (ref 210–475)
INR: 1.3 — ABNORMAL HIGH (ref 0.8–1.2)
Platelets: 38 10*3/uL — ABNORMAL LOW (ref 150–400)
Prothrombin Time: 15.9 seconds — ABNORMAL HIGH (ref 11.4–15.2)
Smear Review: NONE SEEN
aPTT: 28 seconds (ref 24–36)

## 2021-07-22 LAB — MRSA NEXT GEN BY PCR, NASAL: MRSA by PCR Next Gen: NOT DETECTED

## 2021-07-22 LAB — LACTATE DEHYDROGENASE: LDH: 381 U/L — ABNORMAL HIGH (ref 98–192)

## 2021-07-22 MED ORDER — ONDANSETRON HCL 4 MG/2ML IJ SOLN: 4.0000 mg | Freq: Four times a day (QID) | INTRAMUSCULAR | Status: DC | PRN

## 2021-07-22 MED ORDER — ACETAMINOPHEN 325 MG PO TABS
650.0000 mg | ORAL_TABLET | Freq: Four times a day (QID) | ORAL | Status: DC | PRN
  Administered 2021-07-22 – 2021-07-23 (×2): 650 mg via ORAL
  Filled 2021-07-22 (×2): qty 2

## 2021-07-22 MED ORDER — KCL IN DEXTROSE-NACL 20-5-0.45 MEQ/L-%-% IV SOLN
INTRAVENOUS | Status: DC
  Filled 2021-07-22 (×3): qty 1000

## 2021-07-22 MED ORDER — ALBUMIN HUMAN 5 % IV SOLN
12.5000 g | Freq: Once | INTRAVENOUS | Status: AC
  Administered 2021-07-22: 12.5 g via INTRAVENOUS
  Filled 2021-07-22: qty 250

## 2021-07-22 MED ORDER — POLYVINYL ALCOHOL 1.4 % OP SOLN
1.0000 [drp] | Freq: Four times a day (QID) | OPHTHALMIC | Status: DC | PRN
  Filled 2021-07-22: qty 15

## 2021-07-22 MED ORDER — VANCOMYCIN HCL 1250 MG/250ML IV SOLN: 1250.0000 mg | INTRAVENOUS | Status: DC

## 2021-07-22 MED ORDER — ORAL CARE MOUTH RINSE
15.0000 mL | Freq: Two times a day (BID) | OROMUCOSAL | Status: DC
  Administered 2021-07-22 – 2021-07-23 (×2): 15 mL via OROMUCOSAL

## 2021-07-22 MED ORDER — NOREPINEPHRINE 4 MG/250ML-% IV SOLN
2.0000 ug/min | INTRAVENOUS | Status: DC
  Administered 2021-07-22: 6 ug/min via INTRAVENOUS
  Administered 2021-07-22: 4 ug/min via INTRAVENOUS
  Filled 2021-07-22: qty 250

## 2021-07-22 MED ORDER — NOREPINEPHRINE 4 MG/250ML-% IV SOLN
0.0000 ug/min | INTRAVENOUS | Status: DC
  Filled 2021-07-22: qty 250

## 2021-07-22 MED ORDER — ACETAMINOPHEN 650 MG RE SUPP: 650.0000 mg | Freq: Four times a day (QID) | RECTAL | Status: DC | PRN

## 2021-07-22 MED ORDER — MORPHINE SULFATE (PF) 2 MG/ML IV SOLN
INTRAVENOUS | Status: AC
  Filled 2021-07-22: qty 1

## 2021-07-22 MED ORDER — POTASSIUM CHLORIDE 10 MEQ/100ML IV SOLN
10.0000 meq | INTRAVENOUS | Status: AC
Start: 2021-07-22 — End: 2021-07-22
  Administered 2021-07-22 (×3): 10 meq via INTRAVENOUS
  Filled 2021-07-22 (×3): qty 100

## 2021-07-22 MED ORDER — MORPHINE SULFATE (PF) 2 MG/ML IV SOLN
1.0000 mg | INTRAVENOUS | Status: DC | PRN
  Administered 2021-07-22 – 2021-07-23 (×2): 2 mg via INTRAVENOUS
  Filled 2021-07-22 (×2): qty 1

## 2021-07-22 MED ORDER — MORPHINE SULFATE (PF) 2 MG/ML IV SOLN
1.0000 mg | INTRAVENOUS | Status: AC
  Administered 2021-07-22: 1 mg via INTRAVENOUS

## 2021-07-22 MED ORDER — GLYCOPYRROLATE 0.2 MG/ML IJ SOLN: 0.2000 mg | INTRAMUSCULAR | Status: DC | PRN

## 2021-07-22 MED ORDER — HALOPERIDOL LACTATE 5 MG/ML IJ SOLN: 2.5000 mg | INTRAMUSCULAR | Status: DC | PRN

## 2021-07-22 MED ORDER — GLYCOPYRROLATE 1 MG PO TABS
1.0000 mg | ORAL_TABLET | ORAL | Status: DC | PRN
  Filled 2021-07-22: qty 1

## 2021-07-22 MED ORDER — LORAZEPAM 2 MG/ML IJ SOLN
2.0000 mg | INTRAMUSCULAR | Status: DC | PRN
Start: 2021-07-22 — End: 2021-07-24

## 2021-07-22 MED ORDER — DIPHENHYDRAMINE HCL 50 MG/ML IJ SOLN: 25.0000 mg | INTRAMUSCULAR | Status: DC | PRN

## 2021-07-22 MED ORDER — CHLORHEXIDINE GLUCONATE CLOTH 2 % EX PADS
6.0000 | MEDICATED_PAD | Freq: Every day | CUTANEOUS | Status: DC
  Administered 2021-07-22: 6 via TOPICAL

## 2021-07-22 MED ORDER — HEPARIN SODIUM (PORCINE) 5000 UNIT/ML IJ SOLN: 5000.0000 [IU] | Freq: Three times a day (TID) | INTRAMUSCULAR | Status: DC

## 2021-07-22 MED ORDER — SODIUM CHLORIDE 0.9 % IV SOLN: 250.0000 mL | INTRAVENOUS | Status: DC

## 2021-07-22 MED ORDER — POLYETHYLENE GLYCOL 3350 17 G PO PACK: 17.0000 g | PACK | Freq: Every day | ORAL | Status: DC | PRN

## 2021-07-22 MED ORDER — DOCUSATE SODIUM 100 MG PO CAPS
100.0000 mg | ORAL_CAPSULE | Freq: Two times a day (BID) | ORAL | Status: DC | PRN
  Filled 2021-07-22: qty 1

## 2021-07-22 NOTE — Sepsis Progress Note (Signed)
Sepsis protocol is being monitored by eLink. 

## 2021-07-22 NOTE — Progress Notes (Signed)
Patient arrived to 2H08. Patient in afib RVR with HR 112. Patient A&O to person, place, and situation. Urine and stool present. Sacrum intact. Cyst noted between shoulder blades. Generalized ecchymosis noted.  E-link notified of arrival.

## 2021-07-22 NOTE — ED Notes (Signed)
Paged floor MD about pt consult urologist to place foley.

## 2021-07-22 NOTE — Consult Note (Signed)
Consultation Note Date: 07/22/2021   Patient Name: Andres Murray  DOB: [birth year]  MRN: [id number]  Age / Sex: 87 y.o., male  PCP: Andres Bald, MD Referring Physician: Candee Furbish, MD  Reason for Consultation: Establishing goals of care  HPI/Patient Profile: 87 y.o. male  with past medical history of dementia, prostate cancer, and former smoker who was admitted on 07/21/2021 with c/o bilateral LE weakness. Patient lives at home with son who noticed that the patient was not eating or drinking or taking medications over the 3 days PTA. Patient had several issues with transferring on/off commode.  ER evaluation revealed elevated LFTS with abnormal CT findings in the liver concerning for metastatic cancer of unknown origin.  Palliative Medicine was consulted to discuss [place].  Clinical Assessment and Goals of Care: I have reviewed medical records including EPIC notes, labs and imaging, received report from bedside RN Andres Murray, assessed the patient and then met with patient's son and daughter Andres Murray) with Dr. Tamala Murray to discuss diagnosis prognosis, [place], EOL wishes, disposition and options.  I introduced Palliative Medicine as specialized medical care for people living with serious illness. It focuses on providing relief from the symptoms and stress of a serious illness. The goal is to improve quality of life for both the patient and the family.  We discussed a brief life review of the patient. 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  He was married and lost his wife 3 years ago. He was living in a SNF when the Covid Pandemic hit. Then, with family being unable to visit him, he moved back into his home and lives with his son.   As far as functional status, the patient has had a significant decline over 3-4 days PTA. The son endorses the patient could no longer rise to  standing with standby assistance and had a new shuffling gait over the last week.  As far as nutritional status, the patient had stopped eating and drink or taking medications for the about a week PTA. The son reports encouraging the patient's favorite foods and chocolate Boost as an evening snack, but that the patient did not want to eat or drink anything.   We discussed patient's current illness and what it means in the larger context of patient's on-going co-morbidities.  Natural disease trajectory and expectations at EOL were discussed. Dr. 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 The difference between aggressive medical intervention and comfort care was considered in light of the patient's new diagnosis and prognosis. Both the son and daughter were in agreement to not move forward with aggressive treatment and to focus care on comfort.   I described in detail that a comfort pathway involves no more blood draws, antibiotics, radiography, or aggressive treatments. All efforts will now be focused on comfort, dignity, quality of life, and symptom management.  I attempted to elicit  values and goals of care important to the patient. 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 She reports she asked her father during this admission what he wants and he replied he wants "her and to go home".    Both the daughter and son expressed that they do not want their father to die at home. I shared that his symptom burden is such that Hospice Inpatient may be most appropriate. Their mother passed in the [place] at [facility] and are both in agreement that they would like their father transferred there.   Discussed with patient/family the importance of continued conversation with family and the medical providers  regarding overall plan of care and treatment options, ensuring decisions are within the context of the patient's values and GOCs.    Questions and concerns were addressed. The family was encouraged to call with questions or concerns.   Primary Decision Maker HCPOA  Code Status/Advance Care Planning: DNR HCPOA paperwork received from daughter and copy made to be placed in chart Comfort measures only TOC referral for inpatient hospice placed and Hospice Liaison Andres Murray notified of referral As per primary team, Morphine 1-$RemoveBeforeDE'4mg'BdriYCIatoXqzJP$  IV Q34min PRN Titrate off of oxygen Transfer placed   Prognosis:   < 2 weeks  Discharge Planning: Hospice facility  Primary Diagnoses: Present on Admission:  Severe sepsis with acute organ dysfunction (HCC)  Mild cognitive impairment  Abnormal LFTs  Abnormal liver diagnostic imaging  AKI (acute kidney injury) ([place])  Altered mental status  Thrombocytopenia ([place])   Physical Exam Constitutional:      Appearance: He is ill-appearing.  HENT:     Head: Normocephalic and atraumatic.  Cardiovascular:     Rate and Rhythm: Normal rate.  Pulmonary:     Effort: Pulmonary effort is normal.  Abdominal:     Palpations: Abdomen is soft.  Skin:    General: Skin is warm and dry.  Neurological:     Mental Status: Mental status is at baseline.    Vital Signs: BP 103/62 (BP Location: Right Arm)   Pulse 81   Temp 98.2 F (36.8 C) (Oral)   Resp 19   Wt 60.6 kg   SpO2 91%   BMI 22.23 kg/m  Pain Scale: 0-10   Pain Score: 0-No pain SpO2: SpO2: 91 % O2 Device:SpO2: 91 % O2 Flow Rate: .O2 Flow Rate (L/min): 2 L/min  Palliative Assessment/Data: 20%     I discussed this patient's plan of care with CCM Dr. Tamala Julian, NP Andres Murray, bedside RN Andres Murray and Andres Murray, patient's daughter, patient's son, patient  Thank you for this consult. Palliative medicine will continue to follow and assist holistically.   Time Total: 75 minutes Greater than 50%  of this time  was spent counseling and coordinating care related to the above assessment and plan.  Signed by: Andres Hawks, DNP, FNP-BC Palliative Medicine    Please contact Palliative Medicine Team phone at [phone] for questions and concerns.  For individual provider: See Shea Evans

## 2021-07-22 NOTE — ED Notes (Signed)
I was informed by previous RN, pt was a hard stick for blood cultures. Antibiotics were not delayed still started while phlebotomy and MD notified about blood cultures.

## 2021-07-22 NOTE — Progress Notes (Signed)
eLink Physician-Brief Progress Note Patient Name: Andres Murray DOB: 1934-02-20 MRN: [id number]   Date of Service  07/22/2021  HPI/Events of Note  Elderly patient admitted via the ED with suspected sepsis (focus unclear at the moment), altered mental status, acute kidney injury, imagining concerning for metastatic disease to the liver, he also has failure to thrive.  eICU Interventions  New Patient Evaluation. Volume resuscitation per septic shock protocol, antibiotics, pressors if BP remains low following fluid resuscitation.        [name] 07/22/2021, 3:21 AM

## 2021-07-22 NOTE — H&P (Addendum)
NAME:  Gumesindo Blancett MRN:  [id number] DOB:  1934/04/06 LOS: 0 ADMISSION DATE:  07/21/2021 DATE OF SERVICE:  07/22/2021  CHIEF COMPLAINT:  generalized weakness   HISTORY & PHYSICAL  History of Present Illness  This 87 y.o. Caucasian male reformed smoker presented to the [facility] Emergency Department with complaints of bilateral lower extremity weakness that started 3 days prior to presentation to the point that his son has been having to help get to/from and on/off toilet.  His lower eextremity weakness was apparently a little better today prior to arrival; however, he was demonstrating some confusion/altered mental status that prompted presentation to the ER.  At the time of clinical interview, the patient is somnolent, arousable and follows commands but is unable to meaningfully participate in clinical interview.  No family is at the bedside.  Clinical history was obtained from patient's daughter, who is healthcare power of attorney.  She adds that the patient has recently been on Kenalog for a dermatologic process, for which she could not provide additional information.  The patient also has a history of prostate cancer, treated with radiation several years ago.  He has required urologic consultation for placement of a urinary catheter during his 2 most recent admissions.  ER evaluation revealed numerous lab abnormalities, including leukocytosis and thrombocytopenia, acute kidney injury, and abnormal LFTs along with abnormal CT findings in the liver raising concern for a metastatic process.  REVIEW OF SYSTEMS This patient is critically ill and cannot provide additional history nor review of systems due to mental status/unconsciousness.  Nonetheless, he does endorse weakness.  Denies pain, nausea or vomiting.  Endorses thirst, dehydration.   Past Medical/Surgical/Social/Family History   Past Medical History:  Diagnosis Date   Anxiety    Hyperlipemia    Hypertension     Persistent dry cough     Past Surgical History:  Procedure Laterality Date   KNEE SURGERY Right    TONSILLECTOMY      Social History   Tobacco Use   Smoking status: Former   Smokeless tobacco: Never  Substance Use Topics   Alcohol use: Yes    Comment: Occasional beer    Family History  Problem Relation Age of Onset   Diabetes Mother    Appendicitis Father      Procedures:     Significant Diagnostic Tests:     Micro Data:   Results for orders placed or performed during the hospital encounter of 07/21/21  Resp Panel by RT-PCR (Flu A&B, Covid) Nasopharyngeal Swab     Status: None   Collection Time: 07/21/21  8:36 PM   Specimen: Nasopharyngeal Swab; Nasopharyngeal(NP) swabs in vial transport medium  Result Value Ref Range Status   SARS Coronavirus 2 by RT PCR NEGATIVE NEGATIVE Final    Comment: (NOTE) SARS-CoV-2 target nucleic acids are NOT DETECTED.  The SARS-CoV-2 RNA is generally detectable in upper respiratory specimens during the acute phase of infection. The lowest concentration of SARS-CoV-2 viral copies this assay can detect is 138 copies/mL. A negative result does not preclude SARS-Cov-2 infection and should not be used as the sole basis for treatment or other patient management decisions. A negative result may occur with  improper specimen collection/handling, submission of specimen other than nasopharyngeal swab, presence of viral mutation(s) within the areas targeted by this assay, and inadequate number of viral copies(<138 copies/mL). A negative result must be combined with clinical observations, patient history, and epidemiological information. The expected result is Negative.  Fact  Sheet for Patients:  EntrepreneurPulse.com.au  Fact Sheet for Healthcare Providers:  IncredibleEmployment.be  This test is no t yet approved or cleared by the [place] FDA and  has been authorized for detection and/or  diagnosis of SARS-CoV-2 by FDA under an Emergency Use Authorization (EUA). This EUA will remain  in effect (meaning this test can be used) for the duration of the COVID-19 declaration under Section 564(b)(1) of the Act, 21 U.S.C.section 360bbb-3(b)(1), unless the authorization is terminated  or revoked sooner.       Influenza A by PCR NEGATIVE NEGATIVE Final   Influenza B by PCR NEGATIVE NEGATIVE Final    Comment: (NOTE) The Xpert Xpress SARS-CoV-2/FLU/RSV plus assay is intended as an aid in the diagnosis of influenza from Nasopharyngeal swab specimens and should not be used as a sole basis for treatment. Nasal washings and aspirates are unacceptable for Xpert Xpress SARS-CoV-2/FLU/RSV testing.  Fact Sheet for Patients: EntrepreneurPulse.com.au  Fact Sheet for Healthcare Providers: IncredibleEmployment.be  This test is not yet approved or cleared by the [place] FDA and has been authorized for detection and/or diagnosis of SARS-CoV-2 by FDA under an Emergency Use Authorization (EUA). This EUA will remain in effect (meaning this test can be used) for the duration of the COVID-19 declaration under Section 564(b)(1) of the Act, 21 U.S.C. section 360bbb-3(b)(1), unless the authorization is terminated or revoked.  Performed at [facility], [place] [address] [place], [place] [address]       Antimicrobials:  Cefepime/Flagyl/vancomycin (9/6>>)    Interim history/subjective:     Objective   BP 116/60   Pulse (!) 116   Temp (!) 100.4 F (38 C) (Axillary)   Resp (!) 25   SpO2 93%     There were no vitals filed for this visit.  Intake/Output Summary (Last 24 hours) at 07/22/2021 0005 Last data filed at 07/21/2021 2106 Gross per 24 hour  Intake 1132.2 ml  Output --  Net 1132.2 ml        Examination: GENERAL:  disoriented to time and place, lethargic/drowsy, cachectic, and vissibly dehydrated.  HEAD: normocephalic,  atraumatic EYE: PERRLA, EOM intact, no scleral icterus, no pallor. THROAT/ORAL CAVITY: Oral thrush. No exudate. Mucous membranes are dry. No tonsillar enlargement.  NECK: supple, no thyromegaly, no JVD, no lymphadenopathy. Trachea midline. CHEST/LUNG: symmetric in development and expansion. Good air entry. No crackles. No wheezes. HEART: Regular S1 and S2 without murmur, rub or gallop.  Tachycardia. ABDOMEN: soft, nontender, nondistended. Normoactive bowel sounds. No rebound. No guarding. No hepatosplenomegaly. EXTREMITIES: Edema: none. No cyanosis. No clubbing. 2+ DP pulses LYMPHATIC: no cervical/axillary/inguinal lymph nodes appreciated MUSCULOSKELETAL: No point tenderness. Bulk atrophy.  SKIN:  Diffuse petechial rash, most noticeably over truncal region.   NEUROLOGIC: Doll's eyes intact. Corneal reflex intact. Spontaneous respirations intact. Cranial nerves II-XII are grossly symmetric and physiologic. Babinski absent. No sensory deficit. Motor: 4/5 @ RUE, 4/5 @ LUE, 4/5 @ RLL,  4/5 @ LLL.  DTR: 2+ @ R biceps, 2+ @ L biceps, 2+ @ R patellar,  2+ @ L patellar. No cerebellar signs. Gait was not assessed.   Resolved Hospital Problem list      Assessment & Plan:   ASSESSMENT/PLAN:  ASSESSMENT (included in the Hospital Problem List)  Principal Problem:   Severe sepsis with acute organ dysfunction ([place]) Active Problems:   AKI (acute kidney injury) ([place])   Altered mental status   Abnormal LFTs   Abnormal liver diagnostic imaging   Thrombocytopenia (HCC)  Mild cognitive impairment   By systems: PULMONARY: No acute issues Supplemental oxygen as needed to maintain SpO2 93+%   CARDIOVASCULAR Tachycardia Hypertension at baseline (home meds: Norvasc, Cozaar, Toprol XL), currently normotensive Continue IV fluids   RENAL Acute kidney injury Possible HUS/TTP but no schistocytes on smear at this time Check DIC panel Avoid nephrotoxic drugs Renal dosing of medications Consult  urology for urinary catheter placement Maintenance fluids: D5-1/2NS+20 mEq Kcl @ 100 mL/hr   GASTROINTESTINAL Abnormal LFTs Liver masses on abdominal CT, suspected metastatic disease (unknown primary) GI PROPHYLAXIS: Protonix   HEMATOLOGIC Thrombocytopenia Monitor CBC DVT PROPHYLAXIS: heparin   INFECTIOUS Suspected Gram negative sepsis Continue empiric cefepime/Flagyl/vancomycin Follow up blood, urine cultures   ENDOCRINE: No acute issues   NEUROLOGIC Altered mental status Monitor neurologic status closely with concern for airway compromise   PLAN/RECOMMENDATIONS  Admit to ICU under my service (Attending: Renee Pain, MD) with the diagnoses highlighted above in the active Hospital Problem List (ASSESSMENT). See above    My assessment, plan of care, findings, medications, side effects, etc. were discussed with: nurse, healthcare power of attorney (answered all questions to his/her satisfaction), and daughter updated by phone .   Best practice:  Diet: NPO for now Pain/Anxiety/Delirium protocol (if indicated): N/A VAP protocol (if indicated): N/A DVT prophylaxis: heparin GI prophylaxis: Protonix Glucose control: N/A Mobility/Activity: bedrest for now   Code Status: DNR Family Communication:  healthcare power of attorney (daughter updated by phone) Disposition: admit to ICU   Labs   CBC: Recent Labs  Lab 07/21/21 1519 07/21/21 2230 07/21/21 2339  WBC 17.0* 5.6  --   NEUTROABS 15.0* 5.2  --   HGB 13.7 13.2 12.9*  HCT 41.9 40.8 38.0*  MCV 93.3 94.4  --   PLT 69* 52*  --     Basic Metabolic Panel: Recent Labs  Lab 07/21/21 1519 07/21/21 2230 07/21/21 2339  NA 138 138 141  K 3.4* 3.5 3.2*  CL 104 105  --   CO2 23 19*  --   GLUCOSE 142* 113*  --   BUN 60* 61*  --   CREATININE 2.24* 2.04*  --   CALCIUM 9.3 8.4*  --    GFR: CrCl cannot be calculated (Unknown ideal weight.). Recent Labs  Lab 07/21/21 1519 07/21/21 2036 07/21/21 2230  WBC 17.0*   --  5.6  LATICACIDVEN  --  5.7*  --     Liver Function Tests: Recent Labs  Lab 07/21/21 1519 07/21/21 2230  AST 247* 232*  ALT 304* 287*  ALKPHOS 127* 129*  BILITOT 1.5* 1.4*  PROT 5.9* 5.5*  ALBUMIN 2.5* 2.3*   No results for input(s): LIPASE, AMYLASE in the last 168 hours. No results for input(s): AMMONIA in the last 168 hours.  ABG    Component Value Date/Time   PHART 7.468 (H) 07/21/2021 2339   PCO2ART 29.2 (L) 07/21/2021 2339   PO2ART 70 (L) 07/21/2021 2339   HCO3 20.9 07/21/2021 2339   TCO2 22 07/21/2021 2339   ACIDBASEDEF 1.0 07/21/2021 2339   O2SAT 94.0 07/21/2021 2339     Coagulation Profile: Recent Labs  Lab 07/21/21 1519 07/21/21 2230  INR 1.2 1.2    Cardiac Enzymes: No results for input(s): CKTOTAL, CKMB, CKMBINDEX, TROPONINI in the last 168 hours.  HbA1C: Hgb A1c MFr Bld  Date/Time Value Ref Range Status  03/28/2019 12:09 AM 5.7 (H) 4.8 - 5.6 % Final    Comment:    (NOTE) Pre diabetes:  5.7%-6.4% Diabetes:              >6.4% Glycemic control for   <7.0% adults with diabetes     CBG: No results for input(s): GLUCAP in the last 168 hours.   Past Medical History   Past Medical History:  Diagnosis Date   Anxiety    Hyperlipemia    Hypertension    Persistent dry cough       Surgical History    Past Surgical History:  Procedure Laterality Date   KNEE SURGERY Right    TONSILLECTOMY        Social History   Social History   Socioeconomic History   Marital status: Widowed    Spouse name: Not on file   Number of children: 2   Years of education: Doctorate   Highest education level: Not on file  Occupational History   Occupation: Retired  Tobacco Use   Smoking status: Former   Smokeless tobacco: Never  Substance and Sexual Activity   Alcohol use: Yes    Comment: Occasional beer   Drug use: No   Sexual activity: Not on file  Other Topics Concern   Not on file  Social History Narrative   03/27/19: patient  currently lives at [facility] assisted living.  Previously, lives at [facility] which was in independent living.  His daughter placed him at [facility] as she thought it would improve his medication compliance.  Patient's wife passed away 3 years ago.  Receives a lot of support from his kids, 75 and 91 years old, both live here in [place].      Lives at home with wife, [name].   Right-handed.   4-5 cups caffeine daily.   Social Determinants of Health   Financial Resource Strain: Not on file  Food Insecurity: Not on file  Transportation Needs: Not on file  Physical Activity: Not on file  Stress: Not on file  Social Connections: Not on file      Family History    Family History  Problem Relation Age of Onset   Diabetes Mother    Appendicitis Father    family history includes Appendicitis in his father; Diabetes in his mother.    Allergies No Known Allergies    Current Medications  Current Facility-Administered Medications:    acetaminophen (TYLENOL) suppository 650 mg, 650 mg, Rectal, Once, Harris, Abigail, PA-C   acetaminophen (TYLENOL) tablet 650 mg, 650 mg, Oral, Once, Harris, Abigail, PA-C   ceFEPIme (MAXIPIME) 2 g in sodium chloride 0.9 % 100 mL IVPB, 2 g, Intravenous, Q24H, Heloise Purpura, RPH, Last Rate: 200 mL/hr at 07/21/21 2325, 2 g at 07/21/21 2325   metroNIDAZOLE (FLAGYL) IVPB 500 mg, 500 mg, Intravenous, Once, Harris, Abigail, PA-C, Last Rate: 100 mL/hr at 07/21/21 2322, 500 mg at 07/21/21 2322   [COMPLETED] sodium chloride 0.9 % bolus 1,000 mL, 1,000 mL, Intravenous, Once, Last Rate: 2,000 mL/hr at 07/21/21 2317, 1,000 mL at 07/21/21 2317 **AND** sodium chloride 0.9 % bolus 1,000 mL, 1,000 mL, Intravenous, Once, Harris, Abigail, PA-C   vancomycin (VANCOREADY) IVPB 1250 mg/250 mL, 1,250 mg, Intravenous, Once, Harris, Abigail, PA-C   vancomycin variable dose per unstable renal function (pharmacist dosing), , Does not apply, See admin instructions, Heloise Purpura,  [facility]  Current Outpatient Medications:    amLODipine (NORVASC) 10 MG tablet, Take 1 tablet (10 mg total) by mouth at bedtime., Disp: 30 tablet, Rfl: 0   atorvastatin (LIPITOR) 20 MG tablet, Take 20 mg by mouth at  bedtime., Disp: , Rfl:    donepezil (ARICEPT) 10 MG tablet, Take 1 tablet (10 mg total) by mouth at bedtime. (Patient taking differently: Take 10 mg by mouth daily.), Disp: 90 tablet, Rfl: 4   losartan (COZAAR) 100 MG tablet, Take 1 tablet (100 mg total) by mouth daily., Disp: 30 tablet, Rfl: 6   memantine (NAMENDA) 10 MG tablet, Take 1 tablet (10 mg total) by mouth 2 (two) times daily., Disp: 180 tablet, Rfl: 4   metoprolol succinate (TOPROL-XL) 25 MG 24 hr tablet, Take 25 mg by mouth daily., Disp: , Rfl:    mirtazapine (REMERON) 15 MG tablet, Take 15 mg by mouth at bedtime., Disp: , Rfl:    omeprazole (PRILOSEC OTC) 20 MG tablet, Take 20 mg by mouth every morning., Disp: , Rfl:    predniSONE (DELTASONE) 10 MG tablet, Take 10 mg by mouth See admin instructions. TAKE 4 TABS EVERY MORNING X4 DAYS, 3 TABS X4 DAYS, 2 TABS X4 DAYS, 1 TAB X4 DAYS, Disp: , Rfl:    tamsulosin (FLOMAX) 0.4 MG CAPS capsule, Take 1 capsule (0.4 mg total) by mouth daily., Disp: 30 capsule, Rfl: 0   cephALEXin (KEFLEX) 500 MG capsule, Take 1 capsule (500 mg total) by mouth 4 (four) times daily. (Patient not taking: No sig reported), Disp: 28 capsule, Rfl: 0   tamsulosin (FLOMAX) 0.4 MG CAPS capsule, Take 0.4 mg by mouth daily. (Patient not taking: Reported on 07/21/2021), Disp: , Rfl:    triamcinolone cream (KENALOG) 0.1 %, Apply 1 application topically daily as needed (for skin condition).  (Patient not taking: Reported on 07/21/2021), Disp: , Rfl:    Home Medications  Prior to Admission medications   Medication Sig Start Date End Date Taking? Authorizing Provider  amLODipine (NORVASC) 10 MG tablet Take 1 tablet (10 mg total) by mouth at bedtime. 03/28/19  Yes Winfrey, Alcario Drought, MD  atorvastatin (LIPITOR) 20 MG tablet  Take 20 mg by mouth at bedtime.   Yes [provider]  donepezil (ARICEPT) 10 MG tablet Take 1 tablet (10 mg total) by mouth at bedtime. Patient taking differently: Take 10 mg by mouth daily. 03/30/18  Yes Marcial Pacas, MD  losartan (COZAAR) 100 MG tablet Take 1 tablet (100 mg total) by mouth daily. 06/11/19  Yes Pollina, Gwenyth Allegra, MD  memantine (NAMENDA) 10 MG tablet Take 1 tablet (10 mg total) by mouth 2 (two) times daily. 03/30/18  Yes Marcial Pacas, MD  metoprolol succinate (TOPROL-XL) 25 MG 24 hr tablet Take 25 mg by mouth daily.   Yes [provider]  mirtazapine (REMERON) 15 MG tablet Take 15 mg by mouth at bedtime. 07/11/21  Yes [provider]  omeprazole (PRILOSEC OTC) 20 MG tablet Take 20 mg by mouth every morning.   Yes [provider]  predniSONE (DELTASONE) 10 MG tablet Take 10 mg by mouth See admin instructions. TAKE 4 TABS EVERY MORNING X4 DAYS, 3 TABS X4 DAYS, 2 TABS X4 DAYS, 1 TAB X4 DAYS 07/08/21  Yes [provider]  tamsulosin (FLOMAX) 0.4 MG CAPS capsule Take 1 capsule (0.4 mg total) by mouth daily. 09/10/20  Yes Palumbo, April, MD  cephALEXin (KEFLEX) 500 MG capsule Take 1 capsule (500 mg total) by mouth 4 (four) times daily. Patient not taking: No sig reported 09/10/20   Palumbo, April, MD  tamsulosin (FLOMAX) 0.4 MG CAPS capsule Take 0.4 mg by mouth daily. Patient not taking: Reported on 07/21/2021    [provider]  triamcinolone cream (KENALOG)  0.1 % Apply 1 application topically daily as needed (for skin condition).  Patient not taking: Reported on 07/21/2021    [provider]     Critical care time: 60 minutes.  The treatment and management of the patient's condition was required based on the threat of imminent deterioration. This time reflects time spent by the physician evaluating, providing care and managing the critically ill patient's care. The time was spent at the immediate bedside (or on the same  floor/unit and dedicated to this patient's care). Time involved in separately billable procedures is NOT included int he critical care time indicated above. Family meeting and update time may be included above if and only if the patient is unable/incompetent to participate in clinical interview and/or decision making, and the discussion was necessary to determining treatment decisions.   Renee Pain, MD Board Certified by the ABIM, [place]

## 2021-07-22 NOTE — Progress Notes (Signed)
Palliative Medicine has scheduled a family meeting with patient's Andres Murray (daughter) for today 9/7 at 2pm. Son Gerald Stabs is being notified by daughter [name] of meeting as well.   Monroe Ilsa Iha, FNP-BC Palliative Medicine Team Team Phone # [phone]   NO CHARGE

## 2021-07-22 NOTE — Progress Notes (Signed)
[facility] Progress Note Patient Name: Andres Murray DOB: [birth year] MRN: [id number]   Date of Service  07/22/2021  HPI/Events of Note  K+ 3.1, creatinine 1.8.  eICU Interventions  KCL 10 meq iv Q 1 hour x 3 ordered.        [name] [name] 07/22/2021, 5:22 AM

## 2021-07-22 NOTE — Progress Notes (Signed)
Case reviewed. Metastatic cancer, FTT, and BM failure in dementia patient. Will consult palliative and hopefully get son in here. I think hospice is best option.  Erskine Emery MD PCCM

## 2021-07-22 NOTE — Progress Notes (Signed)
Pharmacy Antibiotic Note  Andres Murray is a 87 y.o. male admitted on 07/21/2021 with sepsis. Pharmacy has been consulted for vancomycin and cefepime dosing.  Cr improved, current CrCl ~25.  Plan: Continue cefepime 2g IV q24h Schedule vancomycin '1250mg'$  IV q48h tomorrow  Weight: 60.6 kg (133 lb 9.6 oz)  Temp (24hrs), Avg:98.7 F (37.1 C), Min:97.6 F (36.4 C), Max:100.4 F (38 C)  Recent Labs  Lab 07/21/21 1519 07/21/21 2036 07/21/21 2230 07/22/21 0036 07/22/21 0409 07/22/21 0410 07/22/21 0723  WBC 17.0*  --  5.6  --  6.5  --  10.6*  CREATININE 2.24*  --  2.04*  --  1.80*  --  1.74*  LATICACIDVEN  --  5.7*  --  3.4*  --  2.6*  --      CrCl cannot be calculated (Unknown ideal weight.).    No Known Allergies  Antimicrobials this admission: cefepime 9/6 >>  vancomycin 9/6 >>  metronidazole 9/6 x1  Microbiology results: Pending  Thank you for allowing pharmacy to be a part of this patient's care.  Arrie Senate, PharmD, BCPS, [facility] Clinical Pharmacist [phone] Please check AMION for all [place] numbers 07/22/2021

## 2021-07-22 NOTE — Sepsis Progress Note (Signed)
Secure chat with bedside nurse regarding blood cultures. One blood culture has been drawn.

## 2021-07-23 DIAGNOSIS — R932 Abnormal findings on diagnostic imaging of liver and biliary tract: Secondary | ICD-10-CM

## 2021-07-23 DIAGNOSIS — Z515 Encounter for palliative care: Secondary | ICD-10-CM

## 2021-07-23 DIAGNOSIS — R6520 Severe sepsis without septic shock: Secondary | ICD-10-CM | POA: Diagnosis not present

## 2021-07-23 DIAGNOSIS — R451 Restlessness and agitation: Secondary | ICD-10-CM

## 2021-07-23 DIAGNOSIS — R531 Weakness: Secondary | ICD-10-CM

## 2021-07-23 DIAGNOSIS — A419 Sepsis, unspecified organism: Secondary | ICD-10-CM | POA: Diagnosis not present

## 2021-07-23 DIAGNOSIS — R0600 Dyspnea, unspecified: Secondary | ICD-10-CM

## 2021-07-23 LAB — HAPTOGLOBIN: Haptoglobin: 230 mg/dL (ref 38–329)

## 2021-07-23 MED ORDER — DIPHENHYDRAMINE HCL 50 MG/ML IJ SOLN: 25.0000 mg | INTRAMUSCULAR | 0 refills | Status: AC | PRN

## 2021-07-23 MED ORDER — ONDANSETRON HCL 4 MG/2ML IJ SOLN: 4.0000 mg | Freq: Four times a day (QID) | INTRAMUSCULAR | 0 refills | Status: AC | PRN

## 2021-07-23 NOTE — Plan of Care (Signed)

## 2021-07-23 NOTE — TOC Transition Note (Addendum)
Transition of Care [facility]) - CM/SW Discharge Note   Patient Details  Name: Andres Murray MRN: [id number] Date of Birth: 08-30-1934  [facility]) CM/SW Contact:  Trula Ore, [place] Phone Number: 07/23/2021, 12:58 PM   Clinical Narrative:     [facility] [place]   Anticipated DC date: 07/23/2021  Family notified: [name]   Transport by: Corey Harold  ?  Per MD patient ready for DC to [facility] . RN, patient, patient's family, Cheri with [facility],notified of DC. Discharge Summary sent to facility. RN given number for report tele# [phone]. DC packet on chart. DNR signed by MD attached to patients DC packet.Ambulance transport requested for patient.  CSW signing off.   Final next level of care: [place] ([place]) Barriers to Discharge: No Barriers Identified   Patient Goals and CMS Choice Patient states their goals for this hospitalization and ongoing recovery are:: to patient and patients daughter CMS Medicare.gov Compare Post Acute Care list provided to:: Patient Represenative (must comment) (patients daughter [name]) Choice offered to / list presented to : Adult Children (Patients daughter [name])  Discharge Placement              Patient chooses bed at:  [facility] [place]) Patient to be transferred to facility by: [place] Name of family member notified: [name] Patient and family notified of of transfer: 07/23/21  Discharge Plan and Services                                     Social Determinants of Health (SDOH) Interventions     Readmission Risk Interventions No flowsheet data found.

## 2021-07-23 NOTE — Progress Notes (Signed)
   Referral received from [place] NP [facility]. I have reviewed the chart. Spoke to the daughter Jonita Albee who is HCPOA. She is understanding and in agreement of hospice services. We do have a bed to be able to accomodates pt and my MD has approved the pt for the [facility].   Spoke to [place] who will reach out for MD approval to d/c and set up ambulance when Hospice home can accommodate him today. Webb Silversmith RN [phone]

## 2021-07-23 NOTE — Discharge Summary (Signed)
Physician Discharge Summary       Patient ID: Andres Murray MRN: [id number] DOB/AGE: October 20, 1934 87 y.o.  Admit date: 07/21/2021 Discharge date: 07/23/2021  Discharge Diagnoses:  Principal Problem:   Severe sepsis with acute organ dysfunction [facility]) Active Problems:   Mild cognitive impairment   Abnormal LFTs   Abnormal liver diagnostic imaging   AKI (acute kidney injury) ([place])   Altered mental status   Thrombocytopenia ([place])   DNR (do not resuscitate)   Weakness generalized   Palliative care by specialist   History of Present Illness: This 87 y.o. Caucasian male reformed smoker presented to the [facility] Emergency Department with complaints of bilateral lower extremity weakness that started 3 days prior to presentation to the point that his son has been having to help get to/from and on/off toilet.  His lower eextremity weakness was apparently a little better today prior to arrival; however, he was demonstrating some confusion/altered mental status that prompted presentation to the ER.  At the time of clinical interview, the patient is somnolent, arousable and follows commands but is unable to meaningfully participate in clinical interview.  No family is at the bedside.  Clinical history was obtained from patient's daughter, who is healthcare power of attorney.  She adds that the patient has recently been on Kenalog for a dermatologic process, for which she could not provide additional information.  The patient also has a history of prostate cancer, treated with radiation several years ago.  He has required urologic consultation for placement of a urinary catheter during his 2 most recent admissions.   ER evaluation revealed numerous lab abnormalities, including leukocytosis and thrombocytopenia, acute kidney injury, and abnormal LFTs along with abnormal CT findings in the liver raising concern for a metastatic process.  Hospital Course:   The patient was admitted to  the ICU 9/7 with workup and treatment for what was felt to be sepsis with antibiotics and volume resuscitation.  In the setting of multiple co-morbidities including dementia, probable metastatic cancer, and failure to thrive, palliative medicine was consulted. After their evaluation and discussion with family, it was determined they would seek inpatient hospice care. On 9/8 a bed was made available in [facility] and he was deemed a candidate for transfer there.    Discharge Plan Severe sepsis Acute kidney injury Multiple hepatic masses worrisome for metastatic dz. Hx prostate cancer.  Altered mental status Dementia - Discharge to inpatient hospice facility: [place], [facility].  - Pain control will be ordered by receiving facility - Zofran, Benadryl, and Robinul per PCCM comfort protocols   Significant Hospital tests/ studies  CT head: age related changes CT abdomen: multiple lesions on liver worrisome for metastatic spread.   Consults  Palliative medicine  Discharge Exam: BP 139/62   Pulse (!) 107   Temp 98.3 F (36.8 C) (Axillary)   Resp (!) 23   Wt 60.6 kg   SpO2 94%   BMI 22.23 kg/m   General:  Frail, elderly male in NAD Neuro:  Somnolent HEENT:  [place]/AT, No JVD noted, PERRL Cardiovascular:  RRR, no MRG Lungs:  Clear, unlabored Abdomen:  Soft, non-distended Musculoskeletal:  No acute deformity Skin:  Intact, MMM   Labs at discharge Lab Results  Component Value Date   CREATININE 1.74 (H) 07/22/2021   BUN 57 (H) 07/22/2021   NA 139 07/22/2021   K 3.9 07/22/2021   CL 113 (H) 07/22/2021   CO2 16 (L) 07/22/2021   Lab  Results  Component Value Date   WBC 10.6 (H) 07/22/2021   HGB 12.0 (L) 07/22/2021   HCT 35.5 (L) 07/22/2021   MCV 91.7 07/22/2021   PLT 37 (L) 07/22/2021   Lab Results  Component Value Date   ALT 287 (H) 07/21/2021   AST 232 (H) 07/21/2021   ALKPHOS 129 (H) 07/21/2021   BILITOT 1.4 (H) 07/21/2021   Lab Results  Component  Value Date   INR 1.3 (H) 07/22/2021   INR 1.2 07/21/2021   INR 1.2 07/21/2021    Current radiology studies CT ABDOMEN PELVIS WO CONTRAST  Result Date: 07/21/2021 CLINICAL DATA:  Distension EXAM: CT ABDOMEN AND PELVIS WITHOUT CONTRAST TECHNIQUE: Multidetector CT imaging of the abdomen and pelvis was performed following the standard protocol without IV contrast. COMPARISON:  CT 02/25/2019 FINDINGS: Lower chest: Lung bases demonstrate no consolidation. Mild posterior pleural thickening. Coronary vascular calcification. Cardiomegaly. Small pericardial effusion. Hepatobiliary: Interim development of numerous hypodense liver masses concerning for metastatic disease. Distended gallbladder without calcified stone or biliary dilatation Pancreas: Unremarkable. No pancreatic ductal dilatation or surrounding inflammatory changes. Spleen: Calcified granuloma Adrenals/Urinary Tract: Adrenal glands are normal. Left parapelvic cyst. Numerous intrarenal stones bilaterally, measuring up to 16 mm lower pole right. No ureteral stones. Slightly thick-walled urinary bladder. Multiple stones in the bladder measuring up to 10 mm in size. Stomach/Bowel: The stomach is nonenlarged. Transverse lie of the cecum. Negative appendix. Extensive diverticular disease of the colon without acute wall thickening. Air and fluid-filled small bowel without distention. Vascular/Lymphatic: Moderate aortic atherosclerosis. No aneurysm. No suspicious nodes Reproductive: Post treatment changes of the prostate Other: Negative for pelvic effusion or free air Musculoskeletal: Degenerative changes. No acute or suspicious osseous abnormality IMPRESSION: 1. Interim development of numerous hypodense liver masses, concerning for metastatic disease. 2. Air and fluid-filled small bowel without significant distention could relate to a mild ileus. No obstruction identified 3. Extensive diverticular disease of the colon without acute inflammatory process 4.  Numerous bilateral kidney stones. Slightly thick-walled urinary bladder with multiple bladder stones. 5. Cardiomegaly with small pericardial effusion. Electronically Signed   By: Donavan Foil M.D.   On: 07/21/2021 23:05   CT HEAD WO CONTRAST (5MM)  Result Date: 07/21/2021 CLINICAL DATA:  Neuro deficit, stroke suspected EXAM: CT HEAD WITHOUT CONTRAST TECHNIQUE: Contiguous axial images were obtained from the base of the skull through the vertex without intravenous contrast. COMPARISON:  Brain MRI 10/11/2016, CT head 01/25/2019 FINDINGS: Brain: There is no acute intracranial hemorrhage, extra-axial fluid collection, or acute infarct. There is encephalomalacia in the left frontal and occipital lobes consistent with prior infarcts. The occipital infarct is new since 03/27/2019, while the frontal lobe infarct is unchanged. Confluent hypodensity throughout the remainder of the subcortical and periventricular white matter likely reflects advanced chronic white matter microangiopathy. There is moderate global parenchymal volume loss with commensurate enlargement of the ventricular system. A subcentimeter fat density lesion in the region of the quadrigeminal plate cistern is unchanged, likely a small lipoma. There is no new mass lesion. There is no midline shift. Vascular: There is calcification of the bilateral cavernous ICAs and vertebral arteries. Skull: Normal. Negative for fracture or focal lesion. Sinuses/Orbits: The imaged paranasal sinuses are clear. The imaged globes and orbits are unremarkable. Other: The mastoid air cells are clear. IMPRESSION: 1. No acute intracranial pathology. 2. Global parenchymal volume loss and advanced chronic white matter microangiopathy. 3. 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 Electronically Signed   By: Court Joy.D.  On: 07/21/2021 16:18   DG Chest Port 1 View  Result Date: 07/22/2021 CLINICAL DATA:  Sepsis EXAM: PORTABLE CHEST 1 VIEW COMPARISON:  None.  FINDINGS: Lung volumes are small, but are symmetric. Mild bibasilar atelectasis. No pneumothorax or pleural effusion. Cardiac size within normal limits. No acute bone abnormality. IMPRESSION: Pulmonary hypoinflation Electronically Signed   By: Fidela Salisbury M.D.   On: 07/22/2021 00:32   DG Chest Port 1 View  Result Date: 07/21/2021 CLINICAL DATA:  Cough EXAM: PORTABLE CHEST 1 VIEW COMPARISON:  03/27/2019 FINDINGS: Heart and mediastinal contours are within normal limits. No focal opacities or effusions. No acute bony abnormality. IMPRESSION: No active disease. Electronically Signed   By: Rolm Baptise M.D.   On: 07/21/2021 19:48    Disposition:  Discharge disposition: 51-Hospice/Medical Facility       Allergies as of 07/23/2021   No Known Allergies      Medication List     STOP taking these medications    amLODipine 10 MG tablet Commonly known as: NORVASC   atorvastatin 20 MG tablet Commonly known as: LIPITOR   cephALEXin 500 MG capsule Commonly known as: KEFLEX   donepezil 10 MG tablet Commonly known as: ARICEPT   losartan 100 MG tablet Commonly known as: COZAAR   memantine 10 MG tablet Commonly known as: Namenda   metoprolol succinate 25 MG 24 hr tablet Commonly known as: TOPROL-XL   mirtazapine 15 MG tablet Commonly known as: REMERON   omeprazole 20 MG tablet Commonly known as: PRILOSEC OTC   predniSONE 10 MG tablet Commonly known as: DELTASONE   tamsulosin 0.4 MG Caps capsule Commonly known as: Flomax   triamcinolone cream 0.1 % Commonly known as: KENALOG       TAKE these medications    diphenhydrAMINE 50 MG/ML injection Commonly known as: BENADRYL Inject 0.5 mLs (25 mg total) into the vein every 4 (four) hours as needed for itching.   ondansetron 4 MG/2ML Soln injection Commonly known as: ZOFRAN Inject 2 mLs (4 mg total) into the vein every 6 (six) hours as needed for nausea.         Discharged Condition: termial  39 minutes of time  have been dedicated to discharge assessment, planning and discharge instructions.   Signed:  Georgann Housekeeper, AGACNP-BC [facility]  See Amion for personal pager PCCM on call pager [phone] until 7pm. Please call Elink 7p-7a. [phone]  07/23/2021 11:08 AM

## 2021-07-23 NOTE — Progress Notes (Signed)
Patient ID: Andres Murray, male   DOB: 04-20-1934, 87 y.o.   MRN: [id number]    Progress Note from the Palliative Medicine Team at [facility]   Patient Name: Andres Murray        Date: 07/23/2021 DOB: 10-05-1934  Age: 87 y.o. MRN#: [id number] Attending Physician: Candee Furbish, MD Primary Care Physician: [name], MD Admit Date: 07/21/2021   Medical records reviewed    87 y.o. male  with past medical history of dementia, prostate cancer, and former smoker who was admitted on 07/21/2021 with c/o bilateral LE weakness. Patient lives at home with son who noticed that the patient was not eating or drinking or taking medications over the 3 days PTA. Patient had several issues with transferring on/off commode.   ER evaluation revealed elevated LFTS with abnormal CT findings in the liver concerning for metastatic liver cancer of unknown origin.  Initial palliative medicine consult completed yesterday,  decision to shift to full comfort path may need  This NP visited patient at the bedside as a follow up to  yesterday's [name], and to assess for palliative medicine needs and emotional support.  Patient appears comfortable on current medications for pain anxiety/and dyspnea.   Family at bedside to include daughter,son and grandson.  All express comfort and decision that focus of care is currently on comfort and dignity at end-of-life.  Plan of care -DNR/DNI -No artificial feeding or hydration now or in the future -No further diagnostics or life prolonging measures -Symptom management to enhance comfort and dignity -Residential hospice for end-of-life care, will again make contact with hospice of the [place].  Prognosis is less than 2 weeks  Education offered on the natural trajectory and expectations at end-of-life.  Questions and concerns addressed.  Questions and concerns addressed   Discussed with bedside RN  Total time spent on the unit was 35 minutes  Greater than 50% of  the time was spent in counseling and coordination of care  Wadie Lessen NP  Palliative Medicine Team Team Phone # [phone] Pager [phone]

## 2021-07-23 NOTE — Progress Notes (Signed)
07/23/2021 I saw and evaluated the patient. Discussed with resident and agree with resident's findings and plan as documented in the resident's note.  I have seen and evaluated the patient for end of life care.  S:  Family at bedside. Patient eating, denies pain although family says he does have it intermittently just not telling us.  O: Blood pressure 139/62, pulse 100, temperature 98.3 F (36.8 C), temperature source Axillary, resp. rate (!) 22, weight 60.6 kg, SpO2 97 %.  Frail elderly man sitting in bed Lungs clear Abdomen soft Ext muscle wasting Moves all 4 ext to command  A:  Likely metastatic cancer with multiorgan failure in frail elderly patient with FTT Encounter for palliative care DNR  P:  - Morphine PRN comfort - Awaiting hospice services to be set up at home, appreciate palliative facilitating - 6N bed if one becomes available  Erskine Emery MD [facility] Prefer epic messenger for cross cover needs If after hours, please call E-link

## 2021-07-27 LAB — CULTURE, BLOOD (ROUTINE X 2)
Culture: NO GROWTH
Culture: NO GROWTH
Special Requests: ADEQUATE

## 2021-08-15 DEATH — deceased

## 2021-08-20 ENCOUNTER — Telehealth: Payer: Self-pay | Admitting: Neurology

## 2021-08-20 NOTE — Telephone Encounter (Signed)
His daughter called, he passed away on 14-Aug-2021.
